# Patient Record
Sex: Female | Born: 1995 | Race: Black or African American | Hispanic: No | Marital: Single | Smoking: Never smoker
Health system: Southern US, Community
[De-identification: ages and names within clinical notes are randomized; demographics above are authoritative.]

## PROBLEM LIST (undated history)

## (undated) DIAGNOSIS — Z789 Other specified health status: Secondary | ICD-10-CM

## (undated) DIAGNOSIS — Z87440 Personal history of urinary (tract) infections: Secondary | ICD-10-CM

## (undated) HISTORY — DX: Personal history of urinary (tract) infections: Z87.440

## (undated) HISTORY — PX: NO PAST SURGERIES: SHX2092

---
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
## 2014-03-25 ENCOUNTER — Emergency Department: Payer: Self-pay | Admitting: Emergency Medicine

## 2014-06-28 ENCOUNTER — Inpatient Hospital Stay
Admit: 2014-06-28 | Disposition: A | Discharge status: Home / Self Care | Payer: Self-pay | Attending: Obstetrics and Gynecology | Admitting: Obstetrics and Gynecology

## 2014-06-28 LAB — CBC WITH DIFFERENTIAL/PLATELET
BASOS PCT: 0.5 %
Basophil #: 0.1 10*3/uL (ref 0.0–0.1)
EOS PCT: 0.2 %
Eosinophil #: 0 10*3/uL (ref 0.0–0.7)
HCT: 41.8 % (ref 35.0–47.0)
HGB: 13.8 g/dL (ref 12.0–16.0)
Lymphocyte #: 1.8 10*3/uL (ref 1.0–3.6)
Lymphocyte %: 14.6 %
MCH: 29.4 pg (ref 26.0–34.0)
MCHC: 33.1 g/dL (ref 32.0–36.0)
MCV: 89 fL (ref 80–100)
MONO ABS: 0.8 x10 3/mm (ref 0.2–0.9)
Monocyte %: 6.2 %
NEUTROS ABS: 9.9 10*3/uL — AB (ref 1.4–6.5)
Neutrophil %: 78.5 %
Platelet: 230 10*3/uL (ref 150–440)
RBC: 4.71 10*6/uL (ref 3.80–5.20)
RDW: 13.8 % (ref 11.5–14.5)
WBC: 12.6 10*3/uL — ABNORMAL HIGH (ref 3.6–11.0)

## 2014-06-28 LAB — DRUG SCREEN, URINE
AMPHETAMINES, UR SCREEN: NEGATIVE
BENZODIAZEPINE, UR SCRN: NEGATIVE
Barbiturates, Ur Screen: NEGATIVE
Cannabinoid 50 Ng, Ur ~~LOC~~: NEGATIVE
Cocaine Metabolite,Ur ~~LOC~~: NEGATIVE
MDMA (Ecstasy)Ur Screen: NEGATIVE
Methadone, Ur Screen: NEGATIVE
Opiate, Ur Screen: NEGATIVE
PHENCYCLIDINE (PCP) UR S: NEGATIVE
Tricyclic, Ur Screen: NEGATIVE

## 2014-06-28 LAB — GC/CHLAMYDIA PROBE AMP

## 2014-06-29 LAB — HEMATOCRIT: HCT: 31.8 % — ABNORMAL LOW (ref 35.0–47.0)

## 2014-07-27 NOTE — H&P (Signed)
L&D Evaluation:  History:  HPI 19 year old G1 at 728w2d [facility] presenting for SROM at term and contractions.  LOF reported around 2100 yesterday.  +CTX, +FM, no VB.  PNC noteable for transfer of care from [place], otherwise uncomplicated   Presents with contractions, leaking fluid   Patient's Medical History No Chronic Illness   Patient's Surgical History none   Medications Pre Natal Vitamins   Allergies shellfish   Social History none   Family History Non-Contributory   ROS:  ROS All systems were reviewed.  HEENT, CNS, GI, GU, Respiratory, CV, Renal and Musculoskeletal systems were found to be normal.   Exam:  Vital Signs stable   Urine Protein not completed   General no apparent distress   Mental Status clear   Chest clear   Heart normal sinus rhythm   Abdomen gravid, non-tender   Estimated Fetal Weight Average for gestational age   Fetal Position vtx   Back no CVAT   Edema no edema   Pelvic no external lesions   Mebranes Ruptured   Description clear   FHT normal rate with no decels, 125, moderate, +accles, no decels   Ucx regular   Impression:  Impression 19 year old G1 at 5628w2d   Plan:  Comments 1) Term labor - admit, expectant managemnt  2) Fetus - category I tracing  3) B pos / ABSC neg / HIV neg / RPR NR / RI / VZI / Hgb AA / 1-hr 82mg  / GBS negative  4) TDAP received 06/07/2014  5) Disposition - pending delivery   Electronic Signatures: Lorrene ReidStaebler, Hershey Knauer M (MD)  (Signed 11-Apr-16 02:05)  Authored: L&D Evaluation   Last Updated: 11-Apr-16 02:05 by Lorrene ReidStaebler, Dionisia Pacholski M (MD)

## 2015-02-15 DIAGNOSIS — O9989 Other specified diseases and conditions complicating pregnancy, childbirth and the puerperium: Secondary | ICD-10-CM | POA: Diagnosis present

## 2015-02-15 DIAGNOSIS — Z3A01 Less than 8 weeks gestation of pregnancy: Secondary | ICD-10-CM | POA: Insufficient documentation

## 2015-02-15 DIAGNOSIS — O2341 Unspecified infection of urinary tract in pregnancy, first trimester: Secondary | ICD-10-CM | POA: Insufficient documentation

## 2015-02-15 LAB — BASIC METABOLIC PANEL
Anion gap: 7 (ref 5–15)
BUN: 10 mg/dL (ref 6–20)
CO2: 26 mmol/L (ref 22–32)
CREATININE: 0.76 mg/dL (ref 0.44–1.00)
Calcium: 9.3 mg/dL (ref 8.9–10.3)
Chloride: 102 mmol/L (ref 101–111)
GFR calc non Af Amer: >60 mL/min (ref >60)
GLUCOSE: 88 mg/dL (ref 65–99)
Potassium: 3 mmol/L — ABNORMAL LOW (ref 3.5–5.1)
Sodium: 135 mmol/L (ref 135–145)

## 2015-02-15 NOTE — ED Notes (Signed)
POCT pregnancy positive 

## 2015-02-15 NOTE — ED Notes (Signed)
Pt in with low back and lower abd pain x 3 weeks.  No vomiting.diarrhea or dysuria.

## 2015-02-16 ENCOUNTER — Emergency Department
Admission: EM | Admit: 2015-02-16 | Discharge: 2015-02-16 | Disposition: A | Discharge status: Home / Self Care | Payer: Medicaid Other | Attending: Emergency Medicine | Admitting: Emergency Medicine

## 2015-02-16 ENCOUNTER — Emergency Department: Payer: Medicaid Other

## 2015-02-16 DIAGNOSIS — N390 Urinary tract infection, site not specified: Secondary | ICD-10-CM

## 2015-02-16 DIAGNOSIS — Z3491 Encounter for supervision of normal pregnancy, unspecified, first trimester: Secondary | ICD-10-CM

## 2015-02-16 LAB — URINALYSIS COMPLETE WITH MICROSCOPIC (ARMC ONLY)
Bilirubin Urine: NEGATIVE
Glucose, UA: NEGATIVE mg/dL
HGB URINE DIPSTICK: NEGATIVE
KETONES UR: NEGATIVE mg/dL
Nitrite: POSITIVE — AB
PH: 6 (ref 5.0–8.0)
Protein, ur: NEGATIVE mg/dL
Specific Gravity, Urine: 1.017 (ref 1.005–1.030)

## 2015-02-16 LAB — CBC WITH DIFFERENTIAL/PLATELET
Basophils Absolute: 0 10*3/uL (ref 0–0.1)
Basophils Relative: 1 %
Eosinophils Absolute: 0.1 10*3/uL (ref 0–0.7)
Eosinophils Relative: 1 %
HEMATOCRIT: 39.5 % (ref 35.0–47.0)
Hemoglobin: 12.9 g/dL (ref 12.0–16.0)
Lymphocytes Relative: 27 %
Lymphs Abs: 1.8 10*3/uL (ref 1.0–3.6)
MCH: 27 pg (ref 26.0–34.0)
MCHC: 32.6 g/dL (ref 32.0–36.0)
MCV: 82.7 fL (ref 80.0–100.0)
MONOS PCT: 10 %
Monocytes Absolute: 0.7 10*3/uL (ref 0.2–0.9)
Neutro Abs: 4.1 10*3/uL (ref 1.4–6.5)
Neutrophils Relative %: 61 %
Platelets: 324 10*3/uL (ref 150–440)
RBC: 4.78 MIL/uL (ref 3.80–5.20)
RDW: 16 % — ABNORMAL HIGH (ref 11.5–14.5)
WBC: 6.7 10*3/uL (ref 3.6–11.0)

## 2015-02-16 LAB — POCT PREGNANCY, URINE: PREG TEST UR: POSITIVE — AB

## 2015-02-16 LAB — HCG, QUANTITATIVE, PREGNANCY: hCG, Beta Chain, Quant, S: 87927 m[IU]/mL — ABNORMAL HIGH (ref <5)

## 2015-02-16 MED ORDER — CEPHALEXIN 500 MG PO CAPS: 500.0000 mg | ORAL_CAPSULE | Freq: Two times a day (BID) | ORAL | Status: AC

## 2015-02-16 MED ORDER — CEPHALEXIN 500 MG PO CAPS
500.0000 mg | ORAL_CAPSULE | Freq: Once | ORAL | Status: AC
  Administered 2015-02-16: 500 mg via ORAL
  Filled 2015-02-16: qty 1

## 2015-02-16 NOTE — ED Provider Notes (Signed)
[facility] Emergency Department Provider Note  ____________________________________________  Time seen: 2:25 AM  I have reviewed the triage vital signs and the nursing notes.   HISTORY  Chief Complaint Abdominal Pain     HPI Kaitlyn Richmond is a 19 y.o. female G1P1presents with pelvic discomfort 3 weeks. Patient states she took a home pregnancy test which was positive however that she does not believe the results and resulting in her visit to the emergency department tonight. Patient admits to urinary frequency and urgency.     Past medical history 1 previous pregnancy full-term no complications There are no active problems to display for this patient.   Past surgical history None No current outpatient prescriptions on file.  Allergies Shellfish allergy  No family history on file.  Social History Social History  Substance Use Topics  . Smoking status: Not on file  . Smokeless tobacco: Not on file  . Alcohol Use: Not on file    Review of Systems  Constitutional: Negative for fever. Eyes: Negative for visual changes. ENT: Negative for sore throat. Cardiovascular: Negative for chest pain. Respiratory: Negative for shortness of breath. Gastrointestinal: Negative for abdominal pain, vomiting and diarrhea. Genitourinary: Negative for dysuria. Positive urinary frequency and urgency Musculoskeletal: Negative for back pain. Skin: Negative for rash. Neurological: Negative for headaches, focal weakness or numbness.   10-point ROS otherwise negative.  ____________________________________________   PHYSICAL EXAM:  VITAL SIGNS: ED Triage Vitals  Enc Vitals Group     BP 02/15/15 2324 145/66 mmHg     Pulse Rate 02/15/15 2324 94     Resp 02/15/15 2324 18     Temp 02/15/15 2324 98 F (36.7 C)     Temp Source 02/15/15 2324 Oral     SpO2 02/15/15 2324 100 %     Weight 02/15/15 2324 130 lb (58.968 kg)     Height 02/15/15 2324   (1.549 m)     Head Cir --      Peak Flow --      Pain Score 02/15/15 2325 0     Pain Loc --      Pain Edu? --      Excl. in GC? --     Constitutional: Alert and oriented. Well appearing and in no distress. Eyes: Conjunctivae are normal. PERRL. Normal extraocular movements. ENT   Head: Normocephalic and atraumatic.   Nose: No congestion/rhinnorhea.   Mouth/Throat: Mucous membranes are moist.   Neck: No stridor. Hematological/Lymphatic/Immunilogical: No cervical lymphadenopathy. Cardiovascular: Normal rate, regular rhythm. Normal and symmetric distal pulses are present in all extremities. No murmurs, rubs, or gallops. Respiratory: Normal respiratory effort without tachypnea nor retractions. Breath sounds are clear and equal bilaterally. No wheezes/rales/rhonchi. Gastrointestinal: Soft and nontender. No distention. There is no CVA tenderness. Genitourinary: deferred Musculoskeletal: Nontender with normal range of motion in all extremities. No joint effusions.  No lower extremity tenderness nor edema. Neurologic:  Normal speech and language. No gross focal neurologic deficits are appreciated. Speech is normal.  Skin:  Skin is warm, dry and intact. No rash noted. Psychiatric: Mood and affect are normal. Speech and behavior are normal. Patient exhibits appropriate insight and judgment.  ____________________________________________    LABS (pertinent positives/negatives)  Labs Reviewed  CBC WITH DIFFERENTIAL/PLATELET - Abnormal; Notable for the following:    RDW 16.0 (*)    All other components within normal limits  BASIC METABOLIC PANEL - Abnormal; Notable for the following:    Potassium 3.0 (*)  All other components within normal limits  URINALYSIS COMPLETEWITH MICROSCOPIC ([facility] ONLY) - Abnormal; Notable for the following:    Color, Urine YELLOW (*)    APPearance CLOUDY (*)    Nitrite POSITIVE (*)    Leukocytes, UA 3+ (*)    Bacteria, UA FEW (*)    Squamous  Epithelial / LPF 6-30 (*)    All other components within normal limits  POCT PREGNANCY, URINE - Abnormal; Notable for the following:    Preg Test, Ur POSITIVE (*)    All other components within normal limits  HCG, QUANTITATIVE, PREGNANCY  POC URINE PREG, ED      RADIOLOGY     US Ob Transvaginal (Final result) Result time: 02/16/15 04:38:46   Final result by Rad Results In Interface (02/16/15 04:38:46)   Narrative:   CLINICAL DATA: Pelvic pain for 3 weeks  EXAM: OBSTETRIC <14 WK US AND TRANSVAGINAL OB US  TECHNIQUE: Both transabdominal and transvaginal ultrasound examinations were performed for complete evaluation of the gestation as well as the maternal uterus, adnexal regions, and pelvic cul-de-sac. Transvaginal technique was performed to assess early pregnancy.  COMPARISON: None.  FINDINGS: Intrauterine gestational sac: Single  Yolk sac: Yes  Embryo: Yes  Cardiac Activity: Yes  Heart Rate: 145 bpm  MSD:  mm  w   d  CRL: 9.0 mm  6 w  6 d         US EDC: 10/06/2015  Maternal uterus/adnexae: Normal right ovary. Nonvisualization of the left ovary. Small subchorionic hemorrhage. No free fluid.  IMPRESSION: Single living intrauterine gestation measuring 6 weeks 6 days by crown-rump length. Small subchorionic hemorrhage.   Electronically Signed By: Ellery Plunkaniel R Mitchell M.D. On: 02/16/2015 04:38          US OB Comp Less 14 Wks (Final result) Result time: 02/16/15 04:38:46   Final result by Rad Results In Interface (02/16/15 04:38:46)   Narrative:   CLINICAL DATA: Pelvic pain for 3 weeks  EXAM: OBSTETRIC <14 WK US AND TRANSVAGINAL OB US  TECHNIQUE: Both transabdominal and transvaginal ultrasound examinations were performed for complete evaluation of the gestation as well as the maternal uterus, adnexal regions, and pelvic cul-de-sac. Transvaginal technique was performed to assess early pregnancy.  COMPARISON:  None.  FINDINGS: Intrauterine gestational sac: Single  Yolk sac: Yes  Embryo: Yes  Cardiac Activity: Yes  Heart Rate: 145 bpm  MSD:  mm  w   d  CRL: 9.0 mm  6 w  6 d         US EDC: 10/06/2015  Maternal uterus/adnexae: Normal right ovary. Nonvisualization of the left ovary. Small subchorionic hemorrhage. No free fluid.  IMPRESSION: Single living intrauterine gestation measuring 6 weeks 6 days by crown-rump length. Small subchorionic hemorrhage.   Electronically Signed By: Ellery Plunkaniel R Mitchell M.D. On: 02/16/2015 04:38        _  INITIAL IMPRESSION / ASSESSMENT AND PLAN / ED COURSE  Pertinent labs & imaging results that were available during my care of the patient were reviewed by me and considered in my medical decision making (see chart for details).    ____________________________________________   FINAL CLINICAL IMPRESSION(S) / ED DIAGNOSES  Final diagnoses:  First trimester pregnancy  UTI (lower urinary tract infection)      Darci Currentandolph N Arabia Nylund, MD 02/16/15 [phone]

## 2015-02-16 NOTE — Discharge Instructions (Signed)
First Trimester of Pregnancy The first trimester of pregnancy is from week 1 until the end of week 12 (months 1 through 3). A week after a sperm fertilizes an egg, the egg will implant on the wall of the uterus. This embryo will begin to develop into a baby. Genes from you and your partner are forming the baby. The female genes determine whether the baby is a boy or a girl. At 6-8 weeks, the eyes and face are formed, and the heartbeat can be seen on ultrasound. At the end of 12 weeks, all the baby's organs are formed.  Now that you are pregnant, you will want to do everything you can to have a healthy baby. Two of the most important things are to get good prenatal care and to follow your health care provider's instructions. Prenatal care is all the medical care you receive before the baby's birth. This care will help prevent, find, and treat any problems during the pregnancy and childbirth. BODY CHANGES Your body goes through many changes during pregnancy. The changes vary from woman to woman.   You may gain or lose a couple of pounds at first.  You may feel sick to your stomach (nauseous) and throw up (vomit). If the vomiting is uncontrollable, call your health care provider.  You may tire easily.  You may develop headaches that can be relieved by medicines approved by your health care provider.  You may urinate more often. Painful urination may mean you have a bladder infection.  You may develop heartburn as a result of your pregnancy.  You may develop constipation because certain hormones are causing the muscles that push waste through your intestines to slow down.  You may develop hemorrhoids or swollen, bulging veins (varicose veins).  Your breasts may begin to grow larger and become tender. Your nipples may stick out more, and the tissue that surrounds them (areola) may become darker.  Your gums may bleed and may be sensitive to brushing and flossing.  Dark spots or blotches (chloasma,  mask of pregnancy) may develop on your face. This will likely fade after the baby is born.  Your menstrual periods will stop.  You may have a loss of appetite.  You may develop cravings for certain kinds of food.  You may have changes in your emotions from day to day, such as being excited to be pregnant or being concerned that something may go wrong with the pregnancy and baby.  You may have more vivid and strange dreams.  You may have changes in your hair. These can include thickening of your hair, rapid growth, and changes in texture. Some women also have hair loss during or after pregnancy, or hair that feels dry or thin. Your hair will most likely return to normal after your baby is born. WHAT TO EXPECT AT YOUR PRENATAL VISITS During a routine prenatal visit:  You will be weighed to make sure you and the baby are growing normally.  Your blood pressure will be taken.  Your abdomen will be measured to track your baby's growth.  The fetal heartbeat will be listened to starting around week 10 or 12 of your pregnancy.  Test results from any previous visits will be discussed. Your health care provider may ask you:  How you are feeling.  If you are feeling the baby move.  If you have had any abnormal symptoms, such as leaking fluid, bleeding, severe headaches, or abdominal cramping.  If you are using any tobacco products,   including cigarettes, chewing tobacco, and electronic cigarettes.  If you have any questions. Other tests that may be performed during your first trimester include:  Blood tests to find your blood type and to check for the presence of any previous infections. They will also be used to check for low iron levels (anemia) and Rh antibodies. Later in the pregnancy, blood tests for diabetes will be done along with other tests if problems develop.  Urine tests to check for infections, diabetes, or protein in the urine.  An ultrasound to confirm the proper growth  and development of the baby.  An amniocentesis to check for possible genetic problems.  Fetal screens for spina bifida and Down syndrome.  You may need other tests to make sure you and the baby are doing well.  HIV (human immunodeficiency virus) testing. Routine prenatal testing includes screening for HIV, unless you choose not to have this test. HOME CARE INSTRUCTIONS  Medicines  Follow your health care provider's instructions regarding medicine use. Specific medicines may be either safe or unsafe to take during pregnancy.  Take your prenatal vitamins as directed.  If you develop constipation, try taking a stool softener if your health care provider approves. Diet  Eat regular, well-balanced meals. Choose a variety of foods, such as meat or vegetable-based protein, fish, milk and low-fat dairy products, vegetables, fruits, and whole grain breads and cereals. Your health care provider will help you determine the amount of weight gain that is right for you.  Avoid raw meat and uncooked cheese. These carry germs that can cause birth defects in the baby.  Eating four or five small meals rather than three large meals a day may help relieve nausea and vomiting. If you start to feel nauseous, eating a few soda crackers can be helpful. Drinking liquids between meals instead of during meals also seems to help nausea and vomiting.  If you develop constipation, eat more high-fiber foods, such as fresh vegetables or fruit and whole grains. Drink enough fluids to keep your urine clear or pale yellow. Activity and Exercise  Exercise only as directed by your health care provider. Exercising will help you:  Control your weight.  Stay in shape.  Be prepared for labor and delivery.  Experiencing pain or cramping in the lower abdomen or low back is a good sign that you should stop exercising. Check with your health care provider before continuing normal exercises.  Try to avoid standing for long  periods of time. Move your legs often if you must stand in one place for a long time.  Avoid heavy lifting.  Wear low-heeled shoes, and practice good posture.  You may continue to have sex unless your health care provider directs you otherwise. Relief of Pain or Discomfort  Wear a good support bra for breast tenderness.   Take warm sitz baths to soothe any pain or discomfort caused by hemorrhoids. Use hemorrhoid cream if your health care provider approves.   Rest with your legs elevated if you have leg cramps or low back pain.  If you develop varicose veins in your legs, wear support hose. Elevate your feet for 15 minutes, 3-4 times a day. Limit salt in your diet. Prenatal Care  Schedule your prenatal visits by the twelfth week of pregnancy. They are usually scheduled monthly at first, then more often in the last 2 months before delivery.  Write down your questions. Take them to your prenatal visits.  Keep all your prenatal visits as directed by your   health care provider. Safety  Wear your seat belt at all times when driving.  Make a list of emergency phone numbers, including numbers for family, friends, the hospital, and police and fire departments. General Tips  Ask your health care provider for a referral to a local prenatal education class. Begin classes no later than at the beginning of month 6 of your pregnancy.  Ask for help if you have counseling or nutritional needs during pregnancy. Your health care provider can offer advice or refer you to specialists for help with various needs.  Do not use hot tubs, steam rooms, or saunas.  Do not douche or use tampons or scented sanitary pads.  Do not cross your legs for long periods of time.  Avoid cat litter boxes and soil used by cats. These carry germs that can cause birth defects in the baby and possibly loss of the fetus by miscarriage or stillbirth.  Avoid all smoking, herbs, alcohol, and medicines not prescribed by  your health care provider. Chemicals in these affect the formation and growth of the baby.  Do not use any tobacco products, including cigarettes, chewing tobacco, and electronic cigarettes. If you need help quitting, ask your health care provider. You may receive counseling support and other resources to help you quit.  Schedule a dentist appointment. At home, brush your teeth with a soft toothbrush and be gentle when you floss. SEEK MEDICAL CARE IF:   You have dizziness.  You have mild pelvic cramps, pelvic pressure, or nagging pain in the abdominal area.  You have persistent nausea, vomiting, or diarrhea.  You have a bad smelling vaginal discharge.  You have pain with urination.  You notice increased swelling in your face, hands, legs, or ankles. SEEK IMMEDIATE MEDICAL CARE IF:   You have a fever.  You are leaking fluid from your vagina.  You have spotting or bleeding from your vagina.  You have severe abdominal cramping or pain.  You have rapid weight gain or loss.  You vomit blood or material that looks like coffee grounds.  You are exposed to German measles and have never had them.  You are exposed to fifth disease or chickenpox.  You develop a severe headache.  You have shortness of breath.  You have any kind of trauma, such as from a fall or a car accident.   This information is not intended to replace advice given to you by your health care provider. Make sure you discuss any questions you have with your health care provider.   Document Released: 02/27/2001 Document Revised: 03/26/2014 Document Reviewed: 01/13/2013 Elsevier Interactive Patient Education 2016 [employer]  

## 2015-02-16 NOTE — ED Notes (Signed)
Pt gone to ultrasound via stretcher 

## 2015-03-20 NOTE — L&D Delivery Note (Signed)
Delivery Note At 5:27 AM a viable female was delivered via Vaginal, Spontaneous Delivery (Presentation: Right Occiput Anterior).  APGAR: 8, 9; weight 6 lb 13 oz (3090 g).   Placenta status: Intact, Spontaneous.  Cord: 3 vessels with the following complications: None.    Anesthesia: None  Episiotomy: None Lacerations: 2nd degree Suture Repair: 2.0 vicryl Est. Blood Loss (mL): 250  Quick  second stage. <1 minute shoulder dystocia relieved with McRoberts and suprapubic pressure. Dystocia could have been affected by maternal position and pushing efforts.  Infant to maternal abdomen for delayed cord clamping, cut by family member after pulsation stopped.   Baby's Name: undecided  Mom to postpartum.  Baby to Couplet care / Skin to Skin.  Kaitlyn Richmond, Kaitlyn Richmond 10/02/2015, 6:16 AM

## 2015-05-02 ENCOUNTER — Encounter: Payer: Self-pay | Admitting: *Deleted

## 2015-05-02 DIAGNOSIS — Z3A18 18 weeks gestation of pregnancy: Secondary | ICD-10-CM | POA: Insufficient documentation

## 2015-05-02 DIAGNOSIS — O9989 Other specified diseases and conditions complicating pregnancy, childbirth and the puerperium: Secondary | ICD-10-CM | POA: Insufficient documentation

## 2015-05-02 DIAGNOSIS — M545 Low back pain: Secondary | ICD-10-CM | POA: Insufficient documentation

## 2015-05-02 LAB — CBC
HEMATOCRIT: 34.5 % — AB (ref 35.0–47.0)
Hemoglobin: 11.6 g/dL — ABNORMAL LOW (ref 12.0–16.0)
MCH: 28.1 pg (ref 26.0–34.0)
MCHC: 33.6 g/dL (ref 32.0–36.0)
MCV: 83.5 fL (ref 80.0–100.0)
Platelets: 269 10*3/uL (ref 150–440)
RBC: 4.14 MIL/uL (ref 3.80–5.20)
RDW: 14 % (ref 11.5–14.5)
WBC: 7.6 10*3/uL (ref 3.6–11.0)

## 2015-05-02 LAB — COMPREHENSIVE METABOLIC PANEL
ALBUMIN: 3.1 g/dL — AB (ref 3.5–5.0)
ALT: 22 U/L (ref 14–54)
AST: 22 U/L (ref 15–41)
Alkaline Phosphatase: 58 U/L (ref 38–126)
Anion gap: 7 (ref 5–15)
BUN: 8 mg/dL (ref 6–20)
CHLORIDE: 103 mmol/L (ref 101–111)
CO2: 26 mmol/L (ref 22–32)
Calcium: 8.7 mg/dL — ABNORMAL LOW (ref 8.9–10.3)
Creatinine, Ser: 0.67 mg/dL (ref 0.44–1.00)
GFR calc Af Amer: >60 mL/min (ref >60)
GFR calc non Af Amer: >60 mL/min (ref >60)
GLUCOSE: 82 mg/dL (ref 65–99)
POTASSIUM: 3.4 mmol/L — AB (ref 3.5–5.1)
SODIUM: 136 mmol/L (ref 135–145)
Total Bilirubin: 0.6 mg/dL (ref 0.3–1.2)
Total Protein: 7.4 g/dL (ref 6.5–8.1)

## 2015-05-02 LAB — HCG, QUANTITATIVE, PREGNANCY: hCG, Beta Chain, Quant, S: 15478 m[IU]/mL — ABNORMAL HIGH (ref <5)

## 2015-05-02 LAB — URINALYSIS COMPLETE WITH MICROSCOPIC (ARMC ONLY)
Bilirubin Urine: NEGATIVE
Glucose, UA: NEGATIVE mg/dL
HGB URINE DIPSTICK: NEGATIVE
Ketones, ur: NEGATIVE mg/dL
Nitrite: NEGATIVE
PROTEIN: 30 mg/dL — AB
SPECIFIC GRAVITY, URINE: 1.02 (ref 1.005–1.030)
pH: 8 (ref 5.0–8.0)

## 2015-05-02 NOTE — ED Notes (Addendum)
Pt is pregnant approx 18 weeks.   Pt states no prenatal care.  Pt has abd pain and back pain.  No vag bleeding. No dysuria.

## 2015-05-03 ENCOUNTER — Emergency Department
Admission: EM | Admit: 2015-05-03 | Discharge: 2015-05-03 | Disposition: A | Discharge status: Home / Self Care | Payer: Medicaid Other | Attending: Emergency Medicine | Admitting: Emergency Medicine

## 2015-05-03 DIAGNOSIS — M545 Low back pain, unspecified: Secondary | ICD-10-CM

## 2015-05-03 DIAGNOSIS — Z3492 Encounter for supervision of normal pregnancy, unspecified, second trimester: Secondary | ICD-10-CM

## 2015-05-03 MED ORDER — ACETAMINOPHEN 325 MG PO TABS
650.0000 mg | ORAL_TABLET | Freq: Once | ORAL | Status: DC
  Filled 2015-05-03: qty 2

## 2015-05-03 NOTE — ED Notes (Signed)
Pt ambulatory to room 24 without difficulty or distress noted; reports G2P1, approx [redacted]wks pregnant (LMP 11-7), no prenatal care "waiting on medicaid"; reports pain to lower back and sides x week with no accomp symptoms; +BS, abd soft/nondist/nontender with palpation

## 2015-05-03 NOTE — ED Provider Notes (Signed)
[facility] Emergency Department Provider Note  ____________________________________________  Time seen: Approximately 120 AM  I have reviewed the triage vital signs and the nursing notes.   HISTORY  Chief Complaint Abdominal Pain and Back Pain    HPI Kaitlyn Richmond is a 20 y.o. female who comes into the hospital today with back and side pain. The patient reports that this started one to one half weeks ago. She has not taken anything for the pain and she reports that she does a lot of lifting all day. The patient denies problems with urination and reports that it just aches and hurts. Her pain as a 7 out of 10 in intensity. She denies any nausea or vomiting. Occasionally she reports she feels spasms and she gets a sharp shooting pain up her back but not down her legs. The patient does not have a primary care physician. She is currently [redacted] weeks pregnant and has not yet sought prenatal care. The patient did have an ultrasound in the emergency department multiple months ago but has not seen anyone since. She reports that she has had some back pain ever since her previous pregnancy and delivery. The patient is here for evaluation.   No past medical history on file.  There are no active problems to display for this patient.   No past surgical history on file.  No current outpatient prescriptions on file.  Allergies Shellfish allergy  No family history on file.  Social History Social History  Substance Use Topics  . Smoking status: Never Smoker   . Smokeless tobacco: Not on file  . Alcohol Use: No    Review of Systems Constitutional: No fever/chills Eyes: No visual changes. ENT: No sore throat. Cardiovascular: Denies chest pain. Respiratory: Denies shortness of breath. Gastrointestinal: No abdominal pain.  No nausea, no vomiting.  No diarrhea.  No constipation. Genitourinary: Negative for dysuria. Musculoskeletal:  back pain. Skin: Negative for  rash. Neurological: Negative for headaches, focal weakness or numbness.  10-point ROS otherwise negative.  ____________________________________________   PHYSICAL EXAM:  VITAL SIGNS: ED Triage Vitals  Enc Vitals Group     BP 05/02/15 2253 116/52 mmHg     Pulse Rate 05/02/15 2248 89     Resp 05/02/15 2248 18     Temp 05/02/15 2248 98.8 F (37.1 C)     Temp Source 05/02/15 2248 Oral     SpO2 05/02/15 2248 99 %     Weight 05/02/15 2248 140 lb (63.504 kg)     Height 05/02/15 2248  (1.575 m)     Head Cir --      Peak Flow --      Pain Score 05/02/15 2250 8     Pain Loc --      Pain Edu? --      Excl. in GC? --     Constitutional: Alert and oriented. Well appearing and in no acute distress. Eyes: Conjunctivae are normal. PERRL. EOMI. Head: Atraumatic. Nose: No congestion/rhinnorhea. Mouth/Throat: Mucous membranes are moist.  Oropharynx non-erythematous. Cardiovascular: Normal rate, regular rhythm. Grossly normal heart sounds.  Good peripheral circulation. Respiratory: Normal respiratory effort.  No retractions. Lungs CTAB. Gastrointestinal: Soft and nontender. No distention. No CVA tenderness Musculoskeletal: No lower extremity tenderness nor edema.  No tenderness to palpation of the patient's back or sides. Neurologic:  Normal speech and language.  Skin:  Skin is warm, dry and intact.  Psychiatric: Mood and affect are normal.   ____________________________________________   LABS (  all labs ordered are listed, but only abnormal results are displayed)  Labs Reviewed  COMPREHENSIVE METABOLIC PANEL - Abnormal; Notable for the following:    Potassium 3.4 (*)    Calcium 8.7 (*)    Albumin 3.1 (*)    All other components within normal limits  CBC - Abnormal; Notable for the following:    Hemoglobin 11.6 (*)    HCT 34.5 (*)    All other components within normal limits  URINALYSIS COMPLETEWITH MICROSCOPIC ([facility] ONLY) - Abnormal; Notable for the following:    Color,  Urine YELLOW (*)    APPearance CLOUDY (*)    Protein, ur 30 (*)    Leukocytes, UA 3+ (*)    Bacteria, UA RARE (*)    Squamous Epithelial / LPF 6-30 (*)    All other components within normal limits  HCG, QUANTITATIVE, PREGNANCY - Abnormal; Notable for the following:    hCG, Beta Chain, Quant, S 15478 (*)    All other components within normal limits   ____________________________________________  EKG  None ____________________________________________  RADIOLOGY  None ____________________________________________   PROCEDURES  Procedure(s) performed: None  Critical Care performed: No  ____________________________________________   INITIAL IMPRESSION / ASSESSMENT AND PLAN / ED COURSE  Pertinent labs & imaging results that were available during my care of the patient were reviewed by me and considered in my medical decision making (see chart for details).  The patient is a 20 year old female who is [redacted] weeks pregnant and comes into the hospital today with some back pain. The patient does not have tenderness to palpation has not been taking any medications. Her urine does not show any significant infection and has rare bacteria. The patient's pain may be contributed to her pregnancy as well as some previous chronic pain. The patient receive a dose of Tylenol and will be reassessed.  The patient's pain is improved. She will be discharged to home.  ____________________________________________   FINAL CLINICAL IMPRESSION(S) / ED DIAGNOSES  Final diagnoses:  Bilateral low back pain without sciatica  Second trimester pregnancy      Rebecka Apley, MD 05/03/15 [phone]

## 2015-05-03 NOTE — Discharge Instructions (Signed)
Back Pain in Pregnancy °Back pain during pregnancy is common. It happens in about half of all pregnancies. It is important for you and your baby that you remain active during your pregnancy. If you feel that back pain is not allowing you to remain active or sleep well, it is time to see your caregiver. Back pain may be caused by several factors related to changes during your pregnancy. Fortunately, unless you had trouble with your back before your pregnancy, the pain is likely to get better after you deliver. °Low back pain usually occurs between the fifth and seventh months of pregnancy. It can, however, happen in the first couple months. Factors that increase the risk of back problems include:  °· Previous back problems. °· Injury to your back. °· Having twins or multiple births. °· A chronic cough. °· Stress. °· Job-related repetitive motions. °· Muscle or spinal disease in the back. °· Family history of back problems, ruptured (herniated) discs, or osteoporosis. °· Depression, anxiety, and panic attacks. °CAUSES  °· When you are pregnant, your body produces a hormone called relaxin. This hormone makes the ligaments connecting the low back and pubic bones more flexible. This flexibility allows the baby to be delivered more easily. When your ligaments are loose, your muscles need to work harder to support your back. Soreness in your back can come from tired muscles. Soreness can also come from back tissues that are irritated since they are receiving less support. °· As the baby grows, it puts pressure on the nerves and blood vessels in your pelvis. This can cause back pain. °· As the baby grows and gets heavier during pregnancy, the uterus pushes the stomach muscles forward and changes your center of gravity. This makes your back muscles work harder to maintain good posture. °SYMPTOMS  °Lumbar pain during pregnancy °Lumbar pain during pregnancy usually occurs at or above the waist in the center of the back. There  may be pain and numbness that radiates into your leg or foot. This is similar to low back pain experienced by non-pregnant women. It usually increases with sitting for long periods of time, standing, or repetitive lifting. Tenderness may also be present in the muscles along your upper back. °Posterior pelvic pain during pregnancy °Pain in the back of the pelvis is more common than lumbar pain in pregnancy. It is a deep pain felt in your side at the waistline, or across the tailbone (sacrum), or in both places. You may have pain on one or both sides. This pain can also go into the buttocks and backs of the upper thighs. Pubic and groin pain may also be present. The pain does not quickly resolve with rest, and morning stiffness may also be present. °Pelvic pain during pregnancy can be brought on by most activities. A high level of fitness before and during pregnancy may or may not prevent this problem. Labor pain is usually 1 to 2 minutes apart, lasts for about 1 minute, and involves a bearing down feeling or pressure in your pelvis. However, if you are at term with the pregnancy, constant low back pain can be the beginning of early labor, and you should be aware of this. °DIAGNOSIS  °X-rays of the back should not be done during the first 12 to 14 weeks of the pregnancy and only when absolutely necessary during the rest of the pregnancy. MRIs do not give off radiation and are safe during pregnancy. MRIs also should only be done when absolutely necessary. °HOME CARE INSTRUCTIONS °· Exercise   as directed by your caregiver. Exercise is the most effective way to prevent or manage back pain. If you have a back problem, it is especially important to avoid sports that require sudden body movements. Swimming and walking are great activities. °· Do not stand in one place for long periods of time. °· Do not wear high heels. °· Sit in chairs with good posture. Use a pillow on your lower back if necessary. Make sure your head  rests over your shoulders and is not hanging forward. °· Try sleeping on your side, preferably the left side, with a pillow or two between your legs. If you are sore after a night's rest, your bed may be too soft. Try placing a board between your mattress and box spring. °· Listen to your body when lifting. If you are experiencing pain, ask for help or try bending your knees more so you can use your leg muscles rather than your back muscles. Squat down when picking up something from the floor. Do not bend over. °· Eat a healthy diet. Try to gain weight within your caregiver's recommendations. °· Use heat or cold packs 3 to 4 times a day for 15 minutes to help with the pain. °· Only take over-the-counter or prescription medicines for pain, discomfort, or fever as directed by your caregiver. °Sudden (acute) back pain °· Use bed rest for only the most extreme, acute episodes of back pain. Prolonged bed rest over 48 hours will aggravate your condition. °· Ice is very effective for acute conditions. °¨ Put ice in a plastic bag. °¨ Place a towel between your skin and the bag. °¨ Leave the ice on for 10 to 20 minutes every 2 hours, or as needed. °· Using heat packs for 30 minutes prior to activities is also helpful. °Continued back pain °See your caregiver if you have continued problems. Your caregiver can help or refer you for appropriate physical therapy. With conditioning, most back problems can be avoided. Sometimes, a more serious issue may be the cause of back pain. You should be seen right away if new problems seem to be developing. Your caregiver may recommend: °· A maternity girdle. °· An elastic sling. °· A back brace. °· A massage therapist or acupuncture. °SEEK MEDICAL CARE IF:  °· You are not able to do most of your daily activities, even when taking the pain medicine you were given. °· You need a referral to a physical therapist or chiropractor. °· You want to try acupuncture. °SEEK IMMEDIATE MEDICAL CARE  IF: °· You develop numbness, tingling, weakness, or problems with the use of your arms or legs. °· You develop severe back pain that is no longer relieved with medicines. °· You have a sudden change in bowel or bladder control. °· You have increasing pain in other areas of the body. °· You develop shortness of breath, dizziness, or fainting. °· You develop nausea, vomiting, or sweating. °· You have back pain which is similar to labor pains. °· You have back pain along with your water breaking or vaginal bleeding. °· You have back pain or numbness that travels down your leg. °· Your back pain developed after you fell. °· You develop pain on one side of your back. You may have a kidney stone. °· You see blood in your urine. You may have a bladder infection or kidney stone. °· You have back pain with blisters. You may have shingles. °Back pain is fairly common during pregnancy but should not be accepted as just part of   the process. Back pain should always be treated as soon as possible. This will make your pregnancy as pleasant as possible.   This information is not intended to replace advice given to you by your health care provider. Make sure you discuss any questions you have with your health care provider.   Document Released: 06/13/2005 Document Revised: 05/28/2011 Document Reviewed: 07/25/2010 Elsevier Interactive Patient Education 2016 [employer].  Second Trimester of Pregnancy The second trimester is from week 13 through week 28, months 4 through 6. The second trimester is often a time when you feel your best. Your body has also adjusted to being pregnant, and you begin to feel better physically. Usually, morning sickness has lessened or quit completely, you may have more energy, and you may have an increase in appetite. The second trimester is also a time when the fetus is growing rapidly. At the end of the sixth month, the fetus is about 9 inches long and weighs about 1 pounds. You will likely  begin to feel the baby move (quickening) between 18 and 20 weeks of the pregnancy. BODY CHANGES Your body goes through many changes during pregnancy. The changes vary from woman to woman.   Your weight will continue to increase. You will notice your lower abdomen bulging out.  You may begin to get stretch marks on your hips, abdomen, and breasts.  You may develop headaches that can be relieved by medicines approved by your health care provider.  You may urinate more often because the fetus is pressing on your bladder.  You may develop or continue to have heartburn as a result of your pregnancy.  You may develop constipation because certain hormones are causing the muscles that push waste through your intestines to slow down.  You may develop hemorrhoids or swollen, bulging veins (varicose veins).  You may have back pain because of the weight gain and pregnancy hormones relaxing your joints between the bones in your pelvis and as a result of a shift in weight and the muscles that support your balance.  Your breasts will continue to grow and be tender.  Your gums may bleed and may be sensitive to brushing and flossing.  Dark spots or blotches (chloasma, mask of pregnancy) may develop on your face. This will likely fade after the baby is born.  A dark line from your belly button to the pubic area (linea nigra) may appear. This will likely fade after the baby is born.  You may have changes in your hair. These can include thickening of your hair, rapid growth, and changes in texture. Some women also have hair loss during or after pregnancy, or hair that feels dry or thin. Your hair will most likely return to normal after your baby is born. WHAT TO EXPECT AT YOUR PRENATAL VISITS During a routine prenatal visit:  You will be weighed to make sure you and the fetus are growing normally.  Your blood pressure will be taken.  Your abdomen will be measured to track your baby's growth.  The  fetal heartbeat will be listened to.  Any test results from the previous visit will be discussed. Your health care provider may ask you:  How you are feeling.  If you are feeling the baby move.  If you have had any abnormal symptoms, such as leaking fluid, bleeding, severe headaches, or abdominal cramping.  If you are using any tobacco products, including cigarettes, chewing tobacco, and electronic cigarettes.  If you have any questions. Other tests  that may be performed during your second trimester include:  Blood tests that check for:  Low iron levels (anemia).  Gestational diabetes (between 24 and 28 weeks).  Rh antibodies.  Urine tests to check for infections, diabetes, or protein in the urine.  An ultrasound to confirm the proper growth and development of the baby.  An amniocentesis to check for possible genetic problems.  Fetal screens for spina bifida and Down syndrome.  HIV (human immunodeficiency virus) testing. Routine prenatal testing includes screening for HIV, unless you choose not to have this test. HOME CARE INSTRUCTIONS   Avoid all smoking, herbs, alcohol, and unprescribed drugs. These chemicals affect the formation and growth of the baby.  Do not use any tobacco products, including cigarettes, chewing tobacco, and electronic cigarettes. If you need help quitting, ask your health care provider. You may receive counseling support and other resources to help you quit.  Follow your health care provider's instructions regarding medicine use. There are medicines that are either safe or unsafe to take during pregnancy.  Exercise only as directed by your health care provider. Experiencing uterine cramps is a good sign to stop exercising.  Continue to eat regular, healthy meals.  Wear a good support bra for breast tenderness.  Do not use hot tubs, steam rooms, or saunas.  Wear your seat belt at all times when driving.  Avoid raw meat, uncooked cheese, cat  litter boxes, and soil used by cats. These carry germs that can cause birth defects in the baby.  Take your prenatal vitamins.  Take 1500-2000 mg of calcium daily starting at the 20th week of pregnancy until you deliver your baby.  Try taking a stool softener (if your health care provider approves) if you develop constipation. Eat more high-fiber foods, such as fresh vegetables or fruit and whole grains. Drink plenty of fluids to keep your urine clear or pale yellow.  Take warm sitz baths to soothe any pain or discomfort caused by hemorrhoids. Use hemorrhoid cream if your health care provider approves.  If you develop varicose veins, wear support hose. Elevate your feet for 15 minutes, 3-4 times a day. Limit salt in your diet.  Avoid heavy lifting, wear low heel shoes, and practice good posture.  Rest with your legs elevated if you have leg cramps or low back pain.  Visit your dentist if you have not gone yet during your pregnancy. Use a soft toothbrush to brush your teeth and be gentle when you floss.  A sexual relationship may be continued unless your health care provider directs you otherwise.  Continue to go to all your prenatal visits as directed by your health care provider. SEEK MEDICAL CARE IF:   You have dizziness.  You have mild pelvic cramps, pelvic pressure, or nagging pain in the abdominal area.  You have persistent nausea, vomiting, or diarrhea.  You have a bad smelling vaginal discharge.  You have pain with urination. SEEK IMMEDIATE MEDICAL CARE IF:   You have a fever.  You are leaking fluid from your vagina.  You have spotting or bleeding from your vagina.  You have severe abdominal cramping or pain.  You have rapid weight gain or loss.  You have shortness of breath with chest pain.  You notice sudden or extreme swelling of your face, hands, ankles, feet, or legs.  You have not felt your baby move in over an hour.  You have severe headaches that do  not go away with medicine.  You have vision  changes.   This information is not intended to replace advice given to you by your health care provider. Make sure you discuss any questions you have with your health care provider.   Document Released: 02/27/2001 Document Revised: 03/26/2014 Document Reviewed: 05/06/2012 Elsevier Interactive Patient Education [employer]

## 2015-05-03 NOTE — ED Notes (Signed)
Pt wishes to leave AMA, Dr Zenda Alpers notified

## 2015-06-15 LAB — HM PAP SMEAR: HM PAP: NEGATIVE

## 2015-06-16 LAB — OB RESULTS CONSOLE HEPATITIS B SURFACE ANTIGEN: Hepatitis B Surface Ag: NEGATIVE

## 2015-06-16 LAB — OB RESULTS CONSOLE RUBELLA ANTIBODY, IGM: Rubella: IMMUNE

## 2015-06-16 LAB — OB RESULTS CONSOLE ABO/RH: RH Type: POSITIVE

## 2015-06-16 LAB — OB RESULTS CONSOLE VARICELLA ZOSTER ANTIBODY, IGG: Varicella: IMMUNE

## 2015-06-16 LAB — OB RESULTS CONSOLE ANTIBODY SCREEN: ANTIBODY SCREEN: NEGATIVE

## 2015-06-16 LAB — OB RESULTS CONSOLE RPR: RPR: NONREACTIVE

## 2015-09-16 LAB — OB RESULTS CONSOLE GBS: STREP GROUP B AG: POSITIVE

## 2015-10-02 ENCOUNTER — Inpatient Hospital Stay
Admission: EM | Admit: 2015-10-02 | Discharge: 2015-10-04 | DRG: 775 | Disposition: A | Discharge status: Home / Self Care | Payer: Medicaid Other | Attending: Obstetrics and Gynecology | Admitting: Obstetrics and Gynecology

## 2015-10-02 ENCOUNTER — Encounter: Payer: Self-pay | Admitting: *Deleted

## 2015-10-02 DIAGNOSIS — O9902 Anemia complicating childbirth: Secondary | ICD-10-CM | POA: Diagnosis present

## 2015-10-02 DIAGNOSIS — Z3A39 39 weeks gestation of pregnancy: Secondary | ICD-10-CM

## 2015-10-02 DIAGNOSIS — O99824 Streptococcus B carrier state complicating childbirth: Principal | ICD-10-CM | POA: Diagnosis present

## 2015-10-02 LAB — CBC
HEMATOCRIT: 36.3 % (ref 35.0–47.0)
HEMOGLOBIN: 12 g/dL (ref 12.0–16.0)
MCH: 25.8 pg — AB (ref 26.0–34.0)
MCHC: 33.2 g/dL (ref 32.0–36.0)
MCV: 77.8 fL — AB (ref 80.0–100.0)
Platelets: 261 10*3/uL (ref 150–440)
RBC: 4.67 MIL/uL (ref 3.80–5.20)
RDW: 16.3 % — ABNORMAL HIGH (ref 11.5–14.5)
WBC: 12.2 10*3/uL — ABNORMAL HIGH (ref 3.6–11.0)

## 2015-10-02 LAB — TYPE AND SCREEN
ABO/RH(D): B POS
ANTIBODY SCREEN: NEGATIVE

## 2015-10-02 LAB — CHLAMYDIA/NGC RT PCR (ARMC ONLY)
CHLAMYDIA TR: NOT DETECTED
N GONORRHOEAE: NOT DETECTED

## 2015-10-02 MED ORDER — KETOROLAC TROMETHAMINE 30 MG/ML IJ SOLN: 30.0000 mg | Freq: Once | INTRAMUSCULAR | Status: DC

## 2015-10-02 MED ORDER — OXYTOCIN BOLUS FROM INFUSION
500.0000 mL | INTRAVENOUS | Status: DC
  Administered 2015-10-02: 500 mL via INTRAVENOUS

## 2015-10-02 MED ORDER — PRENATAL MULTIVITAMIN CH
1.0000 | ORAL_TABLET | Freq: Every day | ORAL | Status: DC
  Administered 2015-10-02: 1 via ORAL
  Filled 2015-10-02: qty 1

## 2015-10-02 MED ORDER — ACETAMINOPHEN 325 MG PO TABS: 650.0000 mg | ORAL_TABLET | ORAL | Status: DC | PRN

## 2015-10-02 MED ORDER — KETOROLAC TROMETHAMINE 30 MG/ML IJ SOLN
30.0000 mg | Freq: Once | INTRAMUSCULAR | Status: AC
  Administered 2015-10-02: 30 mg via INTRAVENOUS

## 2015-10-02 MED ORDER — OXYCODONE-ACETAMINOPHEN 5-325 MG PO TABS
1.0000 | ORAL_TABLET | ORAL | Status: DC | PRN
  Administered 2015-10-02 – 2015-10-04 (×4): 1 via ORAL
  Filled 2015-10-02 (×4): qty 1

## 2015-10-02 MED ORDER — KETOROLAC TROMETHAMINE 30 MG/ML IJ SOLN
INTRAMUSCULAR | Status: AC
  Filled 2015-10-02: qty 1

## 2015-10-02 MED ORDER — CEFAZOLIN SODIUM-DEXTROSE 2-4 GM/100ML-% IV SOLN: 2.0000 g | Freq: Once | INTRAVENOUS | Status: DC

## 2015-10-02 MED ORDER — OXYTOCIN 40 UNITS IN LACTATED RINGERS INFUSION - SIMPLE MED
INTRAVENOUS | Status: AC
  Filled 2015-10-02: qty 1000

## 2015-10-02 MED ORDER — OXYCODONE-ACETAMINOPHEN 5-325 MG PO TABS: 2.0000 | ORAL_TABLET | ORAL | Status: DC | PRN

## 2015-10-02 MED ORDER — ZOLPIDEM TARTRATE 5 MG PO TABS: 5.0000 mg | ORAL_TABLET | Freq: Every evening | ORAL | Status: DC | PRN

## 2015-10-02 MED ORDER — DIBUCAINE 1 % RE OINT: 1.0000 "application " | TOPICAL_OINTMENT | RECTAL | Status: DC | PRN

## 2015-10-02 MED ORDER — ONDANSETRON HCL 4 MG PO TABS: 4.0000 mg | ORAL_TABLET | ORAL | Status: DC | PRN

## 2015-10-02 MED ORDER — SIMETHICONE 80 MG PO CHEW: 80.0000 mg | CHEWABLE_TABLET | ORAL | Status: DC | PRN

## 2015-10-02 MED ORDER — LACTATED RINGERS IV SOLN: 500.0000 mL | INTRAVENOUS | Status: DC | PRN

## 2015-10-02 MED ORDER — OXYTOCIN 40 UNITS IN LACTATED RINGERS INFUSION - SIMPLE MED
2.5000 [IU]/h | INTRAVENOUS | Status: DC
  Administered 2015-10-02: 2.5 [IU]/h via INTRAVENOUS

## 2015-10-02 MED ORDER — IBUPROFEN 600 MG PO TABS
600.0000 mg | ORAL_TABLET | Freq: Four times a day (QID) | ORAL | Status: DC
  Administered 2015-10-02 – 2015-10-03 (×6): 600 mg via ORAL
  Filled 2015-10-02 (×8): qty 1

## 2015-10-02 MED ORDER — DIPHENHYDRAMINE HCL 25 MG PO CAPS: 25.0000 mg | ORAL_CAPSULE | Freq: Four times a day (QID) | ORAL | Status: DC | PRN

## 2015-10-02 MED ORDER — FENTANYL CITRATE (PF) 100 MCG/2ML IJ SOLN
INTRAMUSCULAR | Status: AC
  Filled 2015-10-02: qty 2

## 2015-10-02 MED ORDER — LACTATED RINGERS IV SOLN: INTRAVENOUS | Status: DC

## 2015-10-02 MED ORDER — WITCH HAZEL-GLYCERIN EX PADS: 1.0000 "application " | MEDICATED_PAD | CUTANEOUS | Status: DC | PRN

## 2015-10-02 MED ORDER — FERROUS SULFATE 325 (65 FE) MG PO TABS
325.0000 mg | ORAL_TABLET | Freq: Every day | ORAL | Status: DC
  Administered 2015-10-03 – 2015-10-04 (×2): 325 mg via ORAL
  Filled 2015-10-02 (×2): qty 1

## 2015-10-02 MED ORDER — BENZOCAINE-MENTHOL 20-0.5 % EX AERO: 1.0000 "application " | INHALATION_SPRAY | CUTANEOUS | Status: DC | PRN

## 2015-10-02 MED ORDER — LIDOCAINE HCL (PF) 1 % IJ SOLN
INTRAMUSCULAR | Status: AC
  Filled 2015-10-02: qty 30

## 2015-10-02 MED ORDER — COCONUT OIL OIL: 1.0000 "application " | TOPICAL_OIL | Status: DC | PRN

## 2015-10-02 MED ORDER — LACTATED RINGERS IV SOLN
INTRAVENOUS | Status: DC
  Administered 2015-10-02: 1000 mL via INTRAVENOUS

## 2015-10-02 MED ORDER — CEFAZOLIN IN D5W 1 GM/50ML IV SOLN: 1.0000 g | Freq: Three times a day (TID) | INTRAVENOUS | Status: DC

## 2015-10-02 MED ORDER — ONDANSETRON HCL 4 MG/2ML IJ SOLN: 4.0000 mg | INTRAMUSCULAR | Status: DC | PRN

## 2015-10-02 MED ORDER — CEFAZOLIN SODIUM-DEXTROSE 2-4 GM/100ML-% IV SOLN
INTRAVENOUS | Status: AC
  Administered 2015-10-02: 2000 mg
  Filled 2015-10-02: qty 100

## 2015-10-02 MED ORDER — DOCUSATE SODIUM 100 MG PO CAPS
100.0000 mg | ORAL_CAPSULE | Freq: Two times a day (BID) | ORAL | Status: DC
  Administered 2015-10-02 – 2015-10-04 (×4): 100 mg via ORAL
  Filled 2015-10-02 (×4): qty 1

## 2015-10-02 NOTE — OB Triage Note (Signed)
Presents with complaint of contractions and possible leaking fluid.

## 2015-10-02 NOTE — Discharge Summary (Signed)
Obstetrical Discharge Summary  Date of Admission: 10/02/2015 Date of Discharge: 10/04/15  Primary OB: [facility]  Gestational Age at Delivery: [id number]   Antepartum complications: anemia and late to care Reason for Admission: labor Date of Delivery: 10/02/15  Delivered By: T. Yvette Roark, CNM Delivery Type: spontaneous vaginal delivery Intrapartum complications/course: None Anesthesia: none Placenta: Delivered and expressed via active management. Intact: yes. To pathology: no.  Laceration: 2nd degree Episiotomy: none EBL: 250 Baby: Liveborn female, APGARs 8/9, weight 3090 g.    Discharge Diagnosis: Delivered.   Postpartum course: Normal PP course. Pt ambulating, voiding and tolerating po. Ready for d/c home on PPD #2 Discharge Vital Signs:  Filed Vitals:   10/03/15 1604 10/03/15 2114 10/04/15 0042 10/04/15 0735  BP: 119/64 111/59 109/71 95/56  Pulse: 73 55 65 55  Temp: 98.2 F (36.8 C) 98.2 F (36.8 C) 98.5 F (36.9 C) 98 F (36.7 C)  TempSrc: Oral Oral Oral Oral  Resp: 18 18 18 20   Height:      Weight:      SpO2:  100% 100%       Discharge Exam:  NAD Perineum: WNL Abdomen: firm fundus below the umbilicus. RRR no MRGs CTAB Ext: no c/c/e   Recent Labs Lab 10/02/15 0457  WBC 12.2*  HGB 12.0  HCT 36.3  PLT 261  HCT PP: 32.5  Disposition: Home  Rh Immune globulin given: not applicable Rubella vaccine given: not applicable Tdap vaccine given in AP or PP setting: yes Flu vaccine given in AP or PP setting: N/A  Contraception: Depo-Provera - given at discharge and Rx'd  Prenatal/Postnatal Panel: B+//Rubella Immune//Varicella Immune//RPR negative//HIV negative/HepB Surface Ag negative//plans to bottle feed  Plan:  Kaitlyn Richmond was discharged to home in good condition. Pt to make follow-up appointment with [facility] in 6 weeks for a postpartum visit  Discharge Medications:   Medication List    TAKE these medications        ibuprofen 600 MG tablet  Commonly  known as:  ADVIL,MOTRIN  Take 1 tablet (600 mg total) by mouth every 6 (six) hours.     medroxyPROGESTERone 150 MG/ML injection  Commonly known as:  DEPO-PROVERA  Inject 1 mL (150 mg total) into the muscle every 3 (three) months.     oxyCODONE-acetaminophen 5-325 MG tablet  Commonly known as:  PERCOCET/ROXICET  Take 1 tablet by mouth every 4 (four) hours as needed (pain scale 4-7).

## 2015-10-02 NOTE — H&P (Signed)
Obstetric History and Physical  Kaitlyn Richmond is a 20 y.o. G2P1001 with Estimated Date of Delivery: 10/06/15 per 24 week US who presents at 422w3d  presenting for contractions with SROM shortly after arrival. Patient states she has been having regular contractions, bloody show, intact membranes, with active fetal movement.    Prenatal Course Source of Care: [facility]  with onset of care at 23 weeks Pregnancy complications or risks: Late to prenatal care Anemia - not taking Fe UTI  Patient Active Problem List   Diagnosis Date Noted  . Indication for care in labor or delivery 10/02/2015   She plans to breastfeed She desires Depo-Provera for postpartum contraception.   Prenatal labs and studies: ABO, Rh: B+  Antibody: neg Rubella: Immune per vaccine x 2 Varicella: Immune per vaccine x 2 RPR:  NR HBsAg:  Neg HIV: Neg GC/CT: Neg/Neg GBS: Positive 1 hr Glucola: 87   Genetic screening: too late to care  TDAP: UTD   Prenatal Transfer Tool   History reviewed. No pertinent past medical history.  History reviewed. No pertinent past surgical history.  OB History  Gravida Para Term Preterm AB SAB TAB Ectopic Multiple Living  2 1 1  0 0 0 0 0 0 1    # Outcome Date GA Lbr Len/2nd Weight Sex Delivery Anes PTL Lv  2 Current           1 Term               Social History   Social History  . Marital Status: Single    Spouse Name: N/A  . Number of Children: N/A  . Years of Education: N/A   Social History Main Topics  . Smoking status: Never Smoker   . Smokeless tobacco: None  . Alcohol Use: No  . Drug Use: None  . Sexual Activity: Not Asked   Other Topics Concern  . None   Social History Narrative    Family hx: cancer, diabetes, hypertension  No prescriptions prior to admission    Allergies  Allergen Reactions  . Penicillins Anaphylaxis  . Shellfish Allergy Swelling    Review of Systems: Negative except for what is mentioned in HPI.  Physical Exam: BP 126/65  mmHg  Pulse 104  Temp(Src) 97.7 F (36.5 C) (Oral)  Resp 18  Ht 5\' 2"  (1.575 m)  Wt 152 lb (68.947 kg)  BMI 27.79 kg/m2  LMP 01/24/2015 (Approximate) GENERAL: Well-developed, well-nourished female in no acute distress.  ABDOMEN: Soft, nontender, nondistended, gravid. EXTREMITIES: Nontender, no edema Cervical Exam:6 cm on admission, quickly proceeded to complete Presentation: cephalic FHT: 130 Contractions: regular  Pertinent Labs/Studies:   Results for orders placed or performed during the hospital encounter of 10/02/15 (from the past 24 hour(s))  CBC     Status: Abnormal   Collection Time: 10/02/15  4:57 AM  Result Value Ref Range   WBC 12.2 (H) 3.6 - 11.0 K/uL   RBC 4.67 3.80 - 5.20 MIL/uL   Hemoglobin 12.0 12.0 - 16.0 g/dL   HCT 16.136.3 09.635.0 - 04.547.0 %   MCV 77.8 (L) 80.0 - 100.0 fL   MCH 25.8 (L) 26.0 - 34.0 pg   MCHC 33.2 32.0 - 36.0 g/dL   RDW 40.916.3 (H) 81.111.5 - 91.414.5 %   Platelets 261 150 - 440 K/uL  Type and screen [facility]     Status: None (Preliminary result)   Collection Time: 10/02/15  4:57 AM  Result Value Ref Range  ABO/RH(D) PENDING    Antibody Screen PENDING    Sample Expiration 10/05/2015     Assessment : IUP at [redacted]w[redacted]d in active labor  Plan: Admit for labor and Antibiotics for GBS prophylaxis  See delivery note

## 2015-10-03 LAB — RPR: RPR Ser Ql: NONREACTIVE

## 2015-10-03 LAB — CBC
HEMATOCRIT: 32.5 % — AB (ref 35.0–47.0)
Hemoglobin: 10.7 g/dL — ABNORMAL LOW (ref 12.0–16.0)
MCH: 26 pg (ref 26.0–34.0)
MCHC: 33.1 g/dL (ref 32.0–36.0)
MCV: 78.6 fL — AB (ref 80.0–100.0)
PLATELETS: 204 10*3/uL (ref 150–440)
RBC: 4.13 MIL/uL (ref 3.80–5.20)
RDW: 16 % — AB (ref 11.5–14.5)
WBC: 10.1 10*3/uL (ref 3.6–11.0)

## 2015-10-03 NOTE — Progress Notes (Signed)
Post Partum Day 1 Subjective: Doing well, no complaints.  Tolerating regular diet, pain with PO meds, voiding and ambulating without difficulty.  No CP SOB F/C N/V or leg pain No HA, change of vision, RUQ/epigastric pain  Objective: BP 103/60 mmHg  Pulse 64  Temp(Src) 97.6 F (36.4 C) (Oral)  Resp 18  Ht 5\' 2"  (1.575 m)  Wt 68.947 kg (152 lb)  BMI 27.79 kg/m2  SpO2 100%  LMP 01/24/2015 (Approximate)  Breastfeeding  Physical Exam:  General: NAD CV: RRR Pulm: nl effort, CTABL Lochia: moderate Uterine Fundus: fundus firm and below umbilicus DVT Evaluation: no cords, ttp LEs    Recent Labs  10/02/15 0457 10/03/15 0703  HGB 12.0 10.7*  HCT 36.3 32.5*  WBC 12.2* 10.1  PLT 261 204    Assessment/Plan: 20 y.o. G2P2001 postpartum day # 1  1. Continue routine postpartum care 2. O+, Rubella Immune, Varicella Immune 3. TDAP UTD 4. Breastfeeding/Depo 5. Disposition: home tomorrow   Tresea MallGLEDHILL,Jianni Batten, CNM

## 2015-10-04 MED ORDER — IBUPROFEN 600 MG PO TABS: 600.0000 mg | ORAL_TABLET | Freq: Four times a day (QID) | ORAL | Status: DC

## 2015-10-04 MED ORDER — MEDROXYPROGESTERONE ACETATE 150 MG/ML IM SUSP: 150.0000 mg | INTRAMUSCULAR | Status: DC

## 2015-10-04 MED ORDER — MEDROXYPROGESTERONE ACETATE 150 MG/ML IM SUSP
150.0000 mg | Freq: Once | INTRAMUSCULAR | Status: AC
  Administered 2015-10-04: 150 mg via INTRAMUSCULAR
  Filled 2015-10-04: qty 1

## 2015-10-04 MED ORDER — OXYCODONE-ACETAMINOPHEN 5-325 MG PO TABS: 1.0000 | ORAL_TABLET | ORAL | Status: DC | PRN

## 2015-10-04 NOTE — Progress Notes (Signed)
D/C order from MD.  Reviewed d/c instructions and prescriptions with patient and answered any questions.  Patient d/c home with infant via wheelchair by nursing/auxillary. 

## 2015-10-04 NOTE — Discharge Instructions (Signed)
Discharge instructions:   Call office if you have any of the following: headache, visual changes, fever >100 F, chills, breast concerns, excessive vaginal bleeding, incision drainage or problems, leg pain or redness, depression or any other concerns.   Activity: Do not lift > 10 lbs for 6 weeks.  No intercourse or tampons for 6 weeks.  No driving for 1-2 weeks.  Vaginal Delivery, Care After Refer to this sheet in the next few weeks. These discharge instructions provide you with information on caring for yourself after delivery. Your health care provider may also give you specific instructions. Your treatment has been planned according to the most current medical practices available, but problems sometimes occur. Call your health care provider if you have any problems or questions after you go home. HOME CARE INSTRUCTIONS  Take over-the-counter or prescription medicines only as directed by your health care provider or pharmacist.  Do not drink alcohol, especially if you are taking medicine to relieve pain.  Do not chew or smoke tobacco.  Do not use illegal drugs.  Continue to use good perineal care. Good perineal care includes:  Wiping your perineum from front to back.  Keeping your perineum clean.  Do not use tampons or douche for the next 6 weeks.   Showers only, no tub baths for the next 6 weeks.   Wear a well-fitting bra that provides breast support.  Eat healthy foods.  Drink enough fluids to keep your urine clear or pale yellow.  Eat high-fiber foods such as whole grain cereals and breads, brown rice, beans, and fresh fruits and vegetables every day. These foods may help prevent or relieve constipation.  Follow your health care provider's recommendations regarding resumption of activities such as climbing stairs, driving, lifting, exercising, or traveling.  No sexual intercourse for at least the next 6 weeks, and then not until you feel ready.   Try to have someone help  you with your household activities and your newborn for at least a few days after you leave the hospital.  Rest as much as possible. Try to rest or take a nap when your newborn is sleeping.  Increase your activities gradually.  Keep all of your scheduled postpartum appointments. It is very important to keep your scheduled follow-up appointments. At these appointments, your health care provider will be checking to make sure that you are healing physically and emotionally. SEEK MEDICAL CARE IF:   You are passing large clots from your vagina. Save any clots to show your health care provider.  You have a foul smelling discharge from your vagina.  You have trouble urinating.  You are urinating frequently.  You have pain when you urinate.  You have a change in your bowel movements.  You have increasing redness, pain, or swelling near your vaginal incision (episiotomy) or vaginal tear.  You have pus draining from your episiotomy or vaginal tear.  Your episiotomy or vaginal tear is separating.  You have painful, hard, or reddened breasts.  You have a severe headache.  You have blurred vision or see spots.  You feel sad or depressed.  You have thoughts of hurting yourself or your newborn.  You have questions about your care, the care of your newborn, or medicines.  You are dizzy or light-headed.  You have a rash.  You have nausea or vomiting.  You are not breastfeeding and have not had a menstrual period by the 12th week after delivery.  You have a fever. SEEK IMMEDIATE MEDICAL CARE IF:  You have persistent pain.  You have chest pain.  You have shortness of breath.  You faint.  You have leg pain.  You have stomach pain.  Your vaginal bleeding saturates two or more sanitary pads in 1 hour.   This information is not intended to replace advice given to you by your health care provider. Make sure you discuss any questions you have with your health care provider.

## 2016-06-15 IMAGING — US US OB COMP LESS 14 WK
1 series · 14 of 28 positions shown · non-contrast
Comparison: None.

CLINICAL DATA: Pelvic pain for 3 weeks

EXAM:
OBSTETRIC <14 WK US AND TRANSVAGINAL OB US
TECHNIQUE: Both transabdominal and transvaginal ultrasound examinations were
performed for complete evaluation of the gestation as well as the
maternal uterus, adnexal regions, and pelvic cul-de-sac.
Transvaginal technique was performed to assess early pregnancy.

[Series 1: us ob comp less 14 wk · 0.17mm/px · 14 of 59 slices shown]
[im 3/59]
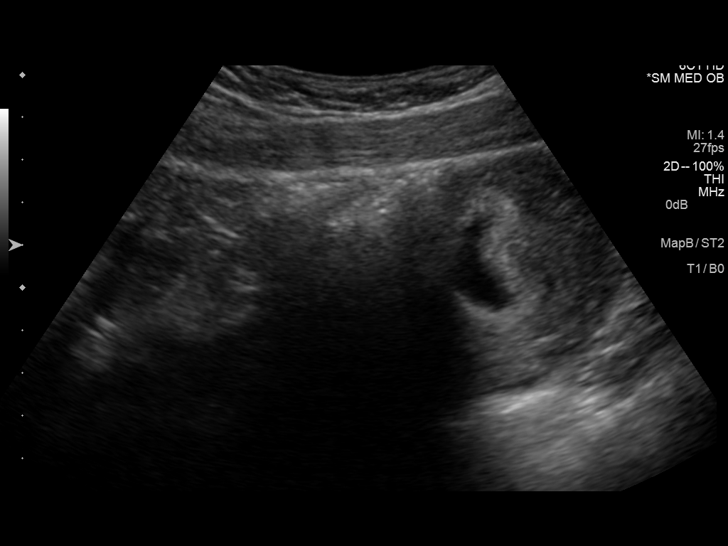
[im 7/59]
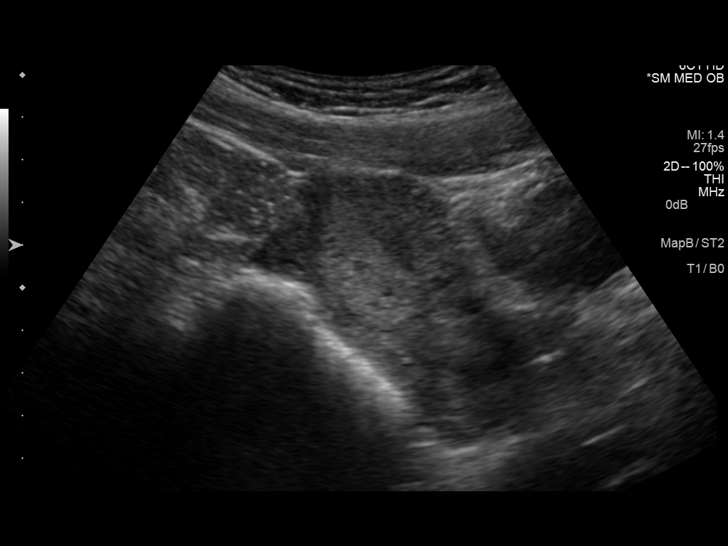
[im 11/59]
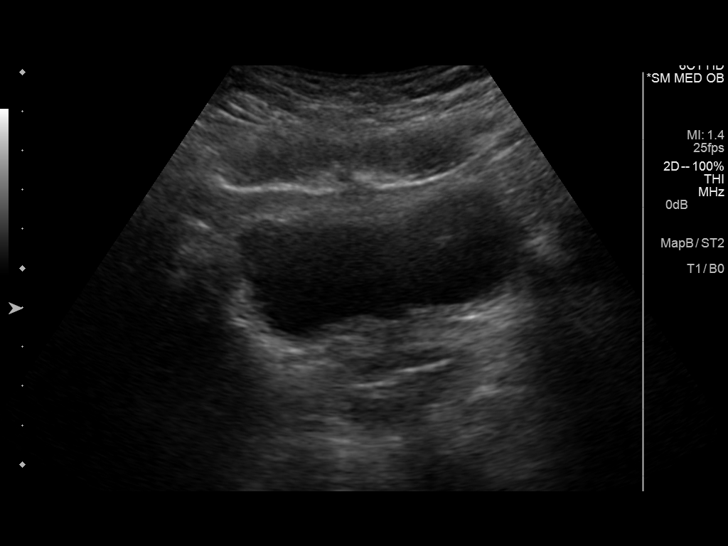
[im 16/59]
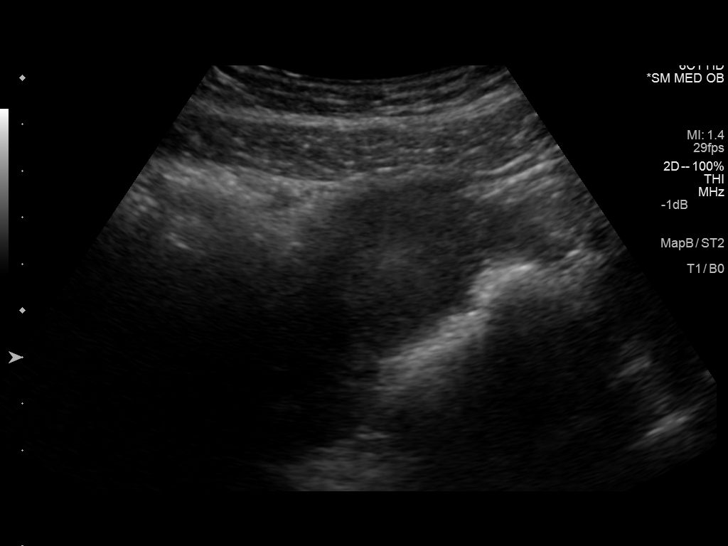
[im 20/59]
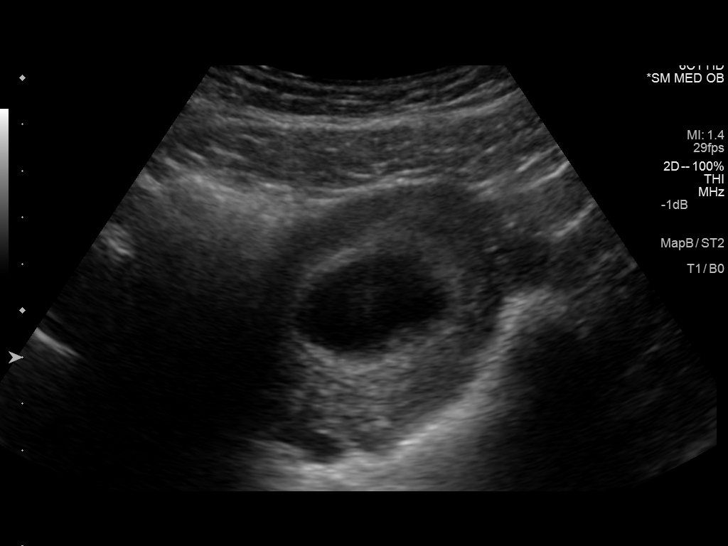
[im 24/59]
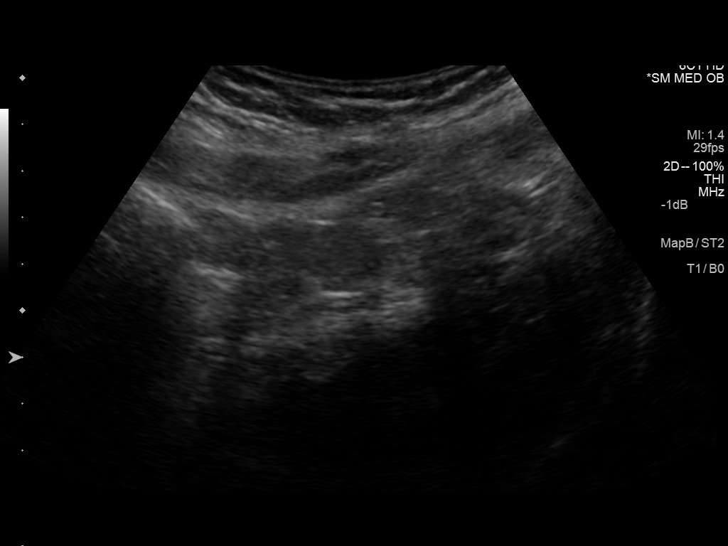
[im 28/59]
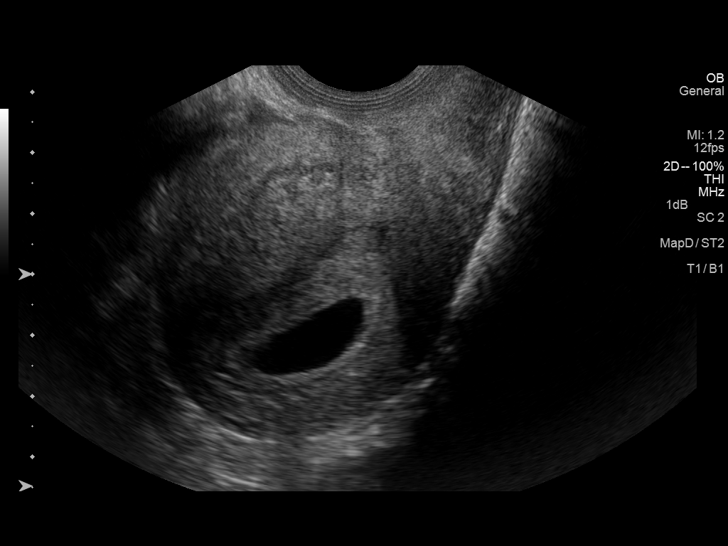
[im 33/59]
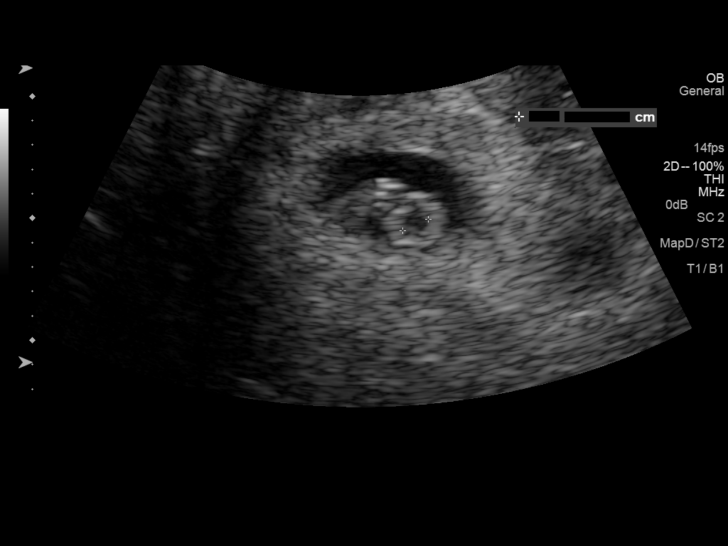
[im 37/59]
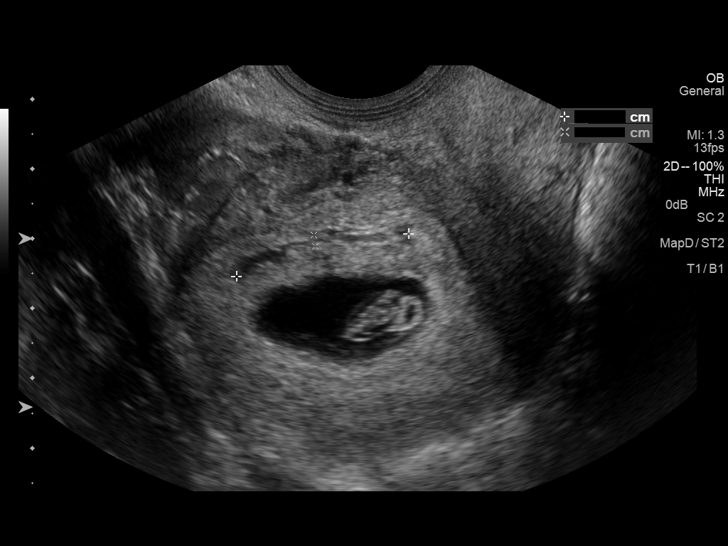
[im 41/59]
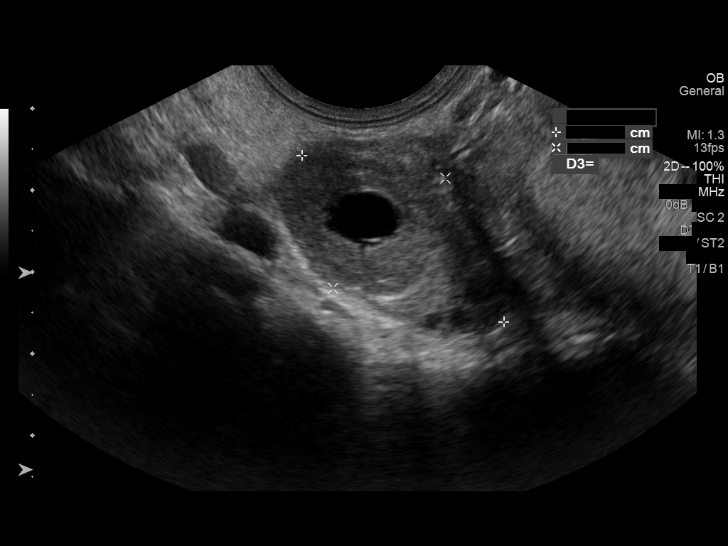
[im 46/59]
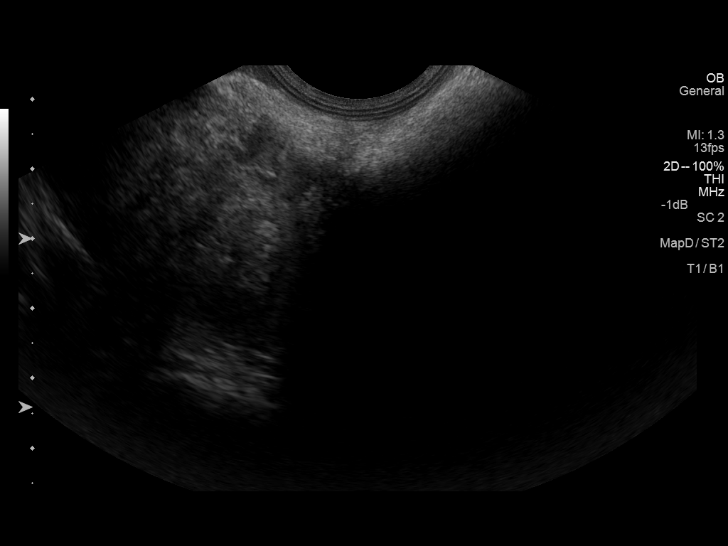
[im 50/59]
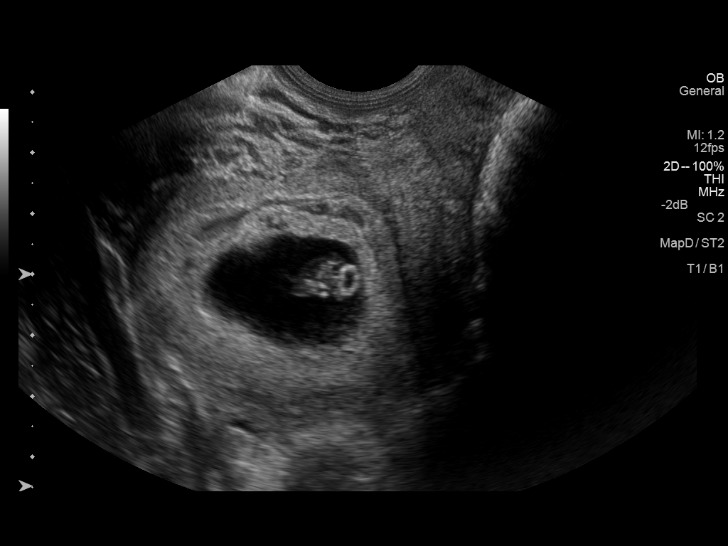
[im 54/59]
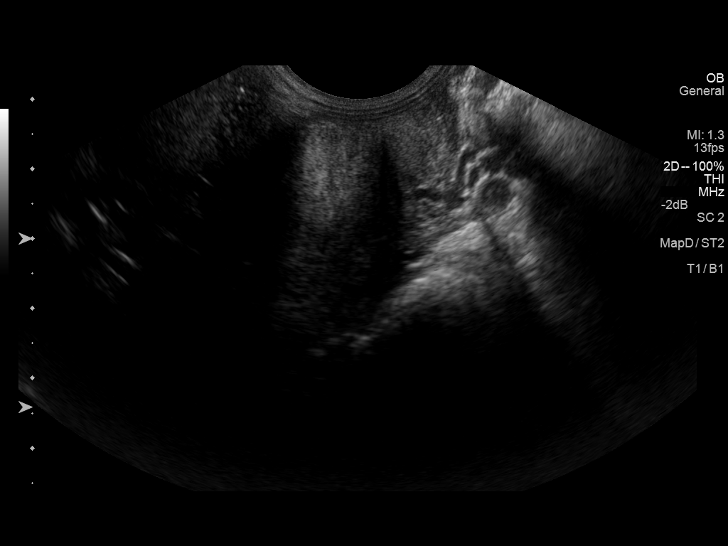
[im 59/59]
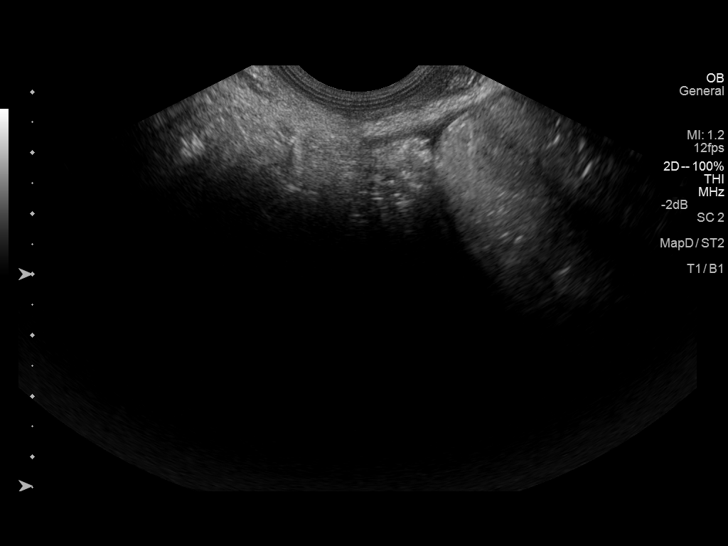

[14 of 28 positions shown; findings below may reference images not displayed]

FINDINGS: Intrauterine gestational sac: Single

Yolk sac:  Yes

Embryo:  Yes

Cardiac Activity: Yes

Heart Rate: 145  bpm

MSD:   mm    w     d

CRL:  9.0  mm   6 w   6 d                  US EDC: 10/06/2015

Maternal uterus/adnexae: Normal right ovary. Nonvisualization of the
left ovary. Small subchorionic hemorrhage. No free fluid.
IMPRESSION: Single living intrauterine gestation measuring 6 weeks 6 days by
crown-rump length. Small subchorionic hemorrhage.

## 2017-01-25 ENCOUNTER — Emergency Department
Admission: EM | Admit: 2017-01-25 | Discharge: 2017-01-25 | Disposition: A | Discharge status: Home / Self Care | Payer: Self-pay | Attending: Emergency Medicine | Admitting: Emergency Medicine

## 2017-01-25 ENCOUNTER — Other Ambulatory Visit: Payer: Self-pay

## 2017-01-25 ENCOUNTER — Encounter: Payer: Self-pay | Admitting: Emergency Medicine

## 2017-01-25 DIAGNOSIS — J029 Acute pharyngitis, unspecified: Secondary | ICD-10-CM | POA: Insufficient documentation

## 2017-01-25 DIAGNOSIS — Z79899 Other long term (current) drug therapy: Secondary | ICD-10-CM | POA: Insufficient documentation

## 2017-01-25 LAB — POCT RAPID STREP A: STREPTOCOCCUS, GROUP A SCREEN (DIRECT): NEGATIVE

## 2017-01-25 MED ORDER — DEXAMETHASONE SODIUM PHOSPHATE 10 MG/ML IJ SOLN
10.0000 mg | Freq: Once | INTRAMUSCULAR | Status: AC
  Administered 2017-01-25: 10 mg via INTRAMUSCULAR
  Filled 2017-01-25: qty 1

## 2017-01-25 MED ORDER — CLINDAMYCIN HCL 150 MG PO CAPS
300.0000 mg | ORAL_CAPSULE | Freq: Once | ORAL | Status: AC
  Administered 2017-01-25: 300 mg via ORAL
  Filled 2017-01-25: qty 2

## 2017-01-25 MED ORDER — MAGIC MOUTHWASH: 5.0000 mL | Freq: Three times a day (TID) | ORAL | 0 refills | Status: DC | PRN

## 2017-01-25 MED ORDER — MAGIC MOUTHWASH
10.0000 mL | Freq: Once | ORAL | Status: AC
  Administered 2017-01-25: 10 mL via ORAL
  Filled 2017-01-25: qty 10

## 2017-01-25 MED ORDER — CLINDAMYCIN HCL 300 MG PO CAPS: 300.0000 mg | ORAL_CAPSULE | Freq: Three times a day (TID) | ORAL | 0 refills | Status: DC

## 2017-01-25 NOTE — ED Notes (Signed)
Rapid Strep Negative EDP aware

## 2017-01-25 NOTE — ED Provider Notes (Signed)
[facility] Emergency Department Provider Note   ____________________________________________   First MD Initiated Contact with Patient 01/25/17 0214     (approximate)  I have reviewed the triage vital signs and the nursing notes.   HISTORY  Chief Complaint Sore Throat    HPI Kaitlyn Richmond is a 21 y.o. female who presents to the ED from home with a chief complaint of sore throat.  Patient reports a 2-day history of sore throat, bilateral earache; taking over-the-counter medications without relief of symptoms.  Denies associated fever, chills, cough, chest pain, shortness of breath, abdominal pain, nausea, vomiting, rash.  Denies recent travel or trauma.  Nothing makes her symptoms better or worse.   Past medical history None  Patient Active Problem List   Diagnosis Date Noted  . Indication for care in labor or delivery 10/02/2015    History reviewed. No pertinent surgical history.  Prior to Admission medications   Medication Sig Start Date End Date Taking? Authorizing Provider  clindamycin (CLEOCIN) 300 MG capsule Take 1 capsule (300 mg total) 3 (three) times daily by mouth. 01/25/17   Irean HongSung, Jade J, MD  ibuprofen (ADVIL,MOTRIN) 600 MG tablet Take 1 tablet (600 mg total) by mouth every 6 (six) hours. 10/04/15   Marta AntuBrothers, Tamara, CNM  magic mouthwash SOLN Take 5 mLs 3 (three) times daily as needed by mouth for mouth pain. 01/25/17   Irean HongSung, Jade J, MD  medroxyPROGESTERone (DEPO-PROVERA) 150 MG/ML injection Inject 1 mL (150 mg total) into the muscle every 3 (three) months. 10/04/15   Marta AntuBrothers, Tamara, CNM  oxyCODONE-acetaminophen (PERCOCET/ROXICET) 5-325 MG tablet Take 1 tablet by mouth every 4 (four) hours as needed (pain scale 4-7). 10/04/15   Marta AntuBrothers, Tamara, CNM    Allergies Penicillins and Shellfish allergy  No family history on file.  Social History Social History   Tobacco Use  . Smoking status: Never Smoker  . Smokeless tobacco: Never  Used  Substance Use Topics  . Alcohol use: No  . Drug use: Not on file    Review of Systems  Constitutional: No fever/chills. Eyes: No visual changes. ENT: Positive for sore throat and bilateral ear pain. Cardiovascular: Denies chest pain. Respiratory: Denies shortness of breath. Gastrointestinal: No abdominal pain.  No nausea, no vomiting.  No diarrhea.  No constipation. Genitourinary: Negative for dysuria. Musculoskeletal: Negative for back pain. Skin: Negative for rash. Neurological: Negative for headaches, focal weakness or numbness.   ____________________________________________   PHYSICAL EXAM:  VITAL SIGNS: ED Triage Vitals  Enc Vitals Group     BP 01/25/17 0206 120/68     Pulse Rate 01/25/17 0206 (!) 107     Resp 01/25/17 0206 20     Temp 01/25/17 0206 98.8 F (37.1 C)     Temp Source 01/25/17 0206 Oral     SpO2 01/25/17 0206 99 %     Weight 01/25/17 0204 141 lb (64 kg)     Height 01/25/17 0204 5\' 2"  (1.575 m)     Head Circumference --      Peak Flow --      Pain Score 01/25/17 0204 10     Pain Loc --      Pain Edu? --      Excl. in GC? --     Constitutional: Alert and oriented. Well appearing and in no acute distress. Eyes: Conjunctivae are normal. PERRL. EOMI. Head: Atraumatic. Ears: Some cerumen bilaterally; otherwise TMs dull without erythema or perforation. Nose: No congestion/rhinnorhea. Mouth/Throat: Mucous membranes  are moist.  Oropharynx moderately erythematous with bilateral and symmetrical tonsillar swelling.  No exudates or peritonsillar abscess.  Hoarse voice.  There is no muffled voice or drooling. Neck: No stridor.  Supple neck without meningismus. Hematological/Lymphatic/Immunilogical:Shotty anterior cervical lymphadenopathy. Cardiovascular: Normal rate, regular rhythm. Grossly normal heart sounds.  Good peripheral circulation. Respiratory: Normal respiratory effort.  No retractions. Lungs CTAB. Gastrointestinal: Soft and nontender. No  distention. No abdominal bruits. No CVA tenderness. Musculoskeletal: No lower extremity tenderness nor edema.  No joint effusions. Neurologic:  Normal speech and language. No gross focal neurologic deficits are appreciated. No gait instability. Skin:  Skin is warm, dry and intact. No rash noted.  No petechiae. Psychiatric: Mood and affect are normal. Speech and behavior are normal.  ____________________________________________   LABS (all labs ordered are listed, but only abnormal results are displayed)  Labs Reviewed  CULTURE, GROUP A STREP [facility]  POCT RAPID STREP A   ____________________________________________  EKG  None ____________________________________________  RADIOLOGY  No results found.  ____________________________________________   PROCEDURES  Procedure(s) performed: None  Procedures  Critical Care performed: No  ____________________________________________   INITIAL IMPRESSION / ASSESSMENT AND PLAN / ED COURSE  As part of my medical decision making, I reviewed the following data within the electronic MEDICAL RECORD NUMBER Nursing notes reviewed and incorporated, Labs reviewed  and Notes from prior ED visits.   21 year old otherwise healthy female who presents with sore throat and bilateral earache for 2 days.  Rapid strep test is negative. Will administer IM Decadron, antibiotic, Magic mouthwash and patient will follow-up with her PCP as needed.  Strict return precautions given.  Patient verbalizes understanding and agrees with plan of care.      ____________________________________________   FINAL CLINICAL IMPRESSION(S) / ED DIAGNOSES  Final diagnoses:  Acute pharyngitis, unspecified etiology     ED Discharge Orders        Ordered    clindamycin (CLEOCIN) 300 MG capsule  3 times daily     01/25/17 0257    magic mouthwash SOLN  3 times daily PRN     01/25/17 0257       Note:  This document was prepared using Dragon voice recognition  software and may include unintentional dictation errors.    Irean HongSung, Jade J, MD 01/25/17 [phone]

## 2017-01-25 NOTE — ED Triage Notes (Signed)
Patient ambulatory to triage with steady gait, without difficulty or distress noted, muffled voice; c/o right sided throat pain and earache x 2 days; taking OTC meds without relief

## 2017-01-25 NOTE — ED Notes (Signed)

## 2017-01-25 NOTE — Discharge Instructions (Signed)
1.  Take antibiotic as prescribed (clindamycin 300 mg 3 times daily x 10 days). 2.  You may use Magic mouthwash as needed for discomfort. 3.  Return to the ER for worsening symptoms, persistent vomiting, difficulty breathing or other concerns.

## 2017-01-27 LAB — CULTURE, GROUP A STREP (THRC)

## 2017-08-13 ENCOUNTER — Other Ambulatory Visit: Payer: Self-pay

## 2017-08-13 ENCOUNTER — Emergency Department
Admission: EM | Admit: 2017-08-13 | Discharge: 2017-08-13 | Disposition: A | Discharge status: Home / Self Care | Payer: Self-pay | Attending: Emergency Medicine | Admitting: Emergency Medicine

## 2017-08-13 ENCOUNTER — Encounter: Payer: Self-pay | Admitting: Emergency Medicine

## 2017-08-13 DIAGNOSIS — O26891 Other specified pregnancy related conditions, first trimester: Secondary | ICD-10-CM

## 2017-08-13 DIAGNOSIS — R109 Unspecified abdominal pain: Secondary | ICD-10-CM

## 2017-08-13 DIAGNOSIS — Z3490 Encounter for supervision of normal pregnancy, unspecified, unspecified trimester: Secondary | ICD-10-CM

## 2017-08-13 DIAGNOSIS — Z3A01 Less than 8 weeks gestation of pregnancy: Secondary | ICD-10-CM | POA: Insufficient documentation

## 2017-08-13 DIAGNOSIS — Z79899 Other long term (current) drug therapy: Secondary | ICD-10-CM | POA: Insufficient documentation

## 2017-08-13 DIAGNOSIS — R110 Nausea: Secondary | ICD-10-CM

## 2017-08-13 DIAGNOSIS — O2341 Unspecified infection of urinary tract in pregnancy, first trimester: Secondary | ICD-10-CM | POA: Insufficient documentation

## 2017-08-13 LAB — CBC
HCT: 39.5 % (ref 35.0–47.0)
Hemoglobin: 13 g/dL (ref 12.0–16.0)
MCH: 27.7 pg (ref 26.0–34.0)
MCHC: 32.8 g/dL (ref 32.0–36.0)
MCV: 84.3 fL (ref 80.0–100.0)
Platelets: 344 10*3/uL (ref 150–440)
RBC: 4.69 MIL/uL (ref 3.80–5.20)
RDW: 15.7 % — AB (ref 11.5–14.5)
WBC: 6.4 10*3/uL (ref 3.6–11.0)

## 2017-08-13 LAB — COMPREHENSIVE METABOLIC PANEL
ALBUMIN: 4.3 g/dL (ref 3.5–5.0)
ALT: 33 U/L (ref 14–54)
AST: 30 U/L (ref 15–41)
Alkaline Phosphatase: 49 U/L (ref 38–126)
Anion gap: 9 (ref 5–15)
BILIRUBIN TOTAL: 0.9 mg/dL (ref 0.3–1.2)
BUN: 12 mg/dL (ref 6–20)
CO2: 26 mmol/L (ref 22–32)
CREATININE: 0.74 mg/dL (ref 0.44–1.00)
Calcium: 9.3 mg/dL (ref 8.9–10.3)
Chloride: 101 mmol/L (ref 101–111)
GFR calc Af Amer: >60 mL/min (ref >60)
GLUCOSE: 89 mg/dL (ref 65–99)
Potassium: 3.5 mmol/L (ref 3.5–5.1)
Sodium: 136 mmol/L (ref 135–145)
TOTAL PROTEIN: 8.9 g/dL — AB (ref 6.5–8.1)

## 2017-08-13 LAB — URINALYSIS, COMPLETE (UACMP) WITH MICROSCOPIC
Bacteria, UA: NONE SEEN
Bilirubin Urine: NEGATIVE
Glucose, UA: NEGATIVE mg/dL
KETONES UR: NEGATIVE mg/dL
Nitrite: NEGATIVE
Protein, ur: NEGATIVE mg/dL
Specific Gravity, Urine: 1.025 (ref 1.005–1.030)
pH: 7 (ref 5.0–8.0)

## 2017-08-13 LAB — HCG, QUANTITATIVE, PREGNANCY: HCG, BETA CHAIN, QUANT, S: 2330 m[IU]/mL — AB (ref <5)

## 2017-08-13 MED ORDER — ONDANSETRON 4 MG PO TBDP: ORAL_TABLET | ORAL | 0 refills | Status: DC

## 2017-08-13 MED ORDER — NITROFURANTOIN MONOHYD MACRO 100 MG PO CAPS: 100.0000 mg | ORAL_CAPSULE | Freq: Two times a day (BID) | ORAL | 0 refills | Status: AC

## 2017-08-13 NOTE — Discharge Instructions (Signed)
As we discussed, your lab work-up was reassuring today.  You might have a mild urinary tract infection so we gave you a course of antibiotics.  Please take the full course of treatment (1 week).  The blood pregnancy test suggests that you are no more than [redacted] weeks pregnant, so an ultrasound is unlikely to be beneficial.  Please follow-up with the health department or with [facility] or another OB/GYN provider to schedule an appropriate follow-up prenatal visit.  In the meantime you can start taking over-the-counter prenatal vitamins.  Return to the emergency department if you develop new or worsening symptoms that concern you, such as vaginal bleeding or severe pain.

## 2017-08-13 NOTE — ED Provider Notes (Signed)
[facility] Emergency Department Provider Note  ____________________________________________   First MD Initiated Contact with Patient 08/13/17 1924     (approximate)  I have reviewed the triage vital signs and the nursing notes.   HISTORY  Chief Complaint Abdominal Pain    HPI Kaitlyn Richmond is a 22 y.o. female G3P2 at early stage of pregnancy who presents for evaluation of nausea and some left-sided pain.  The pain is mild and the nausea is mild and not accompanied with any vomiting or diarrhea.  She just recently found out that she was pregnant.  She went to the health department today but she reports that they did not tell her anything except that she was pregnant.  She came here to find out how far along she is.  She reports that she was told that she has to come through the emergency department first before she can get a follow-up appointment with an OB/GYN for prenatal care.  She is in no distress reports no pain at this time.  She has 2 children at home and says that she feels similar to the way she felt when she was pregnant with them.  She has had no vaginal bleeding.  She denies fever/chills, chest pain, shortness of breath, vomiting, vaginal bleeding, dysuria.  The pain is high up on her left side, more in the lower rib area, and is worsened by moving around.  It is mild.  History reviewed. No pertinent past medical history.  Patient Active Problem List   Diagnosis Date Noted  . Indication for care in labor or delivery 10/02/2015    History reviewed. No pertinent surgical history.  Prior to Admission medications   Medication Sig Start Date End Date Taking? Authorizing Provider  clindamycin (CLEOCIN) 300 MG capsule Take 1 capsule (300 mg total) 3 (three) times daily by mouth. 01/25/17   Irean Hong, MD  ibuprofen (ADVIL,MOTRIN) 600 MG tablet Take 1 tablet (600 mg total) by mouth every 6 (six) hours. 10/04/15   Marta Antu, CNM  magic  mouthwash SOLN Take 5 mLs 3 (three) times daily as needed by mouth for mouth pain. 01/25/17   Irean Hong, MD  medroxyPROGESTERone (DEPO-PROVERA) 150 MG/ML injection Inject 1 mL (150 mg total) into the muscle every 3 (three) months. 10/04/15   Brothers, Delaney Meigs, CNM  nitrofurantoin, macrocrystal-monohydrate, (MACROBID) 100 MG capsule Take 1 capsule (100 mg total) by mouth 2 (two) times daily for 7 days. 08/13/17 08/20/17  Loleta Rose, MD  ondansetron (ZOFRAN ODT) 4 MG disintegrating tablet Allow 1-2 tablets to dissolve in your mouth every 8 hours as needed for nausea/vomiting 08/13/17   Loleta Rose, MD  oxyCODONE-acetaminophen (PERCOCET/ROXICET) 5-325 MG tablet Take 1 tablet by mouth every 4 (four) hours as needed (pain scale 4-7). 10/04/15   Marta Antu, CNM    Allergies Penicillins and Shellfish allergy  No family history on file.  Social History Social History   Tobacco Use  . Smoking status: Never Smoker  . Smokeless tobacco: Never Used  Substance Use Topics  . Alcohol use: No  . Drug use: Not on file    Review of Systems Constitutional: No fever/chills Eyes: No visual changes. ENT: No sore throat. Cardiovascular: Denies chest pain. Respiratory: Denies shortness of breath. Gastrointestinal: Mild left-sided pain as described above with nausea, no vomiting. Genitourinary: No vaginal bleeding.  Negative for dysuria. Musculoskeletal: Negative for neck pain.  Negative for back pain. Integumentary: Negative for rash. Neurological: Negative for headaches, focal  weakness or numbness.   ____________________________________________   PHYSICAL EXAM:  VITAL SIGNS: ED Triage Vitals  Enc Vitals Group     BP 08/13/17 1604 116/73     Pulse Rate 08/13/17 1604 84     Resp 08/13/17 1604 16     Temp 08/13/17 1604 98.6 F (37 C)     Temp Source 08/13/17 1604 Oral     SpO2 08/13/17 1604 100 %     Weight 08/13/17 1605 63.5 kg (140 lb)     Height 08/13/17 1605 1.575 m ( )      Head Circumference --      Peak Flow --      Pain Score 08/13/17 1605 0     Pain Loc --      Pain Edu? --      Excl. in GC? --     Constitutional: Alert and oriented. Well appearing and in no acute distress. Eyes: Conjunctivae are normal.  Head: Atraumatic. Nose: No congestion/rhinnorhea. Mouth/Throat: Mucous membranes are moist. Neck: No stridor.  No meningeal signs.   Cardiovascular: Normal rate, regular rhythm. Good peripheral circulation. Grossly normal heart sounds. Respiratory: Normal respiratory effort.  No retractions. Lungs CTAB. Gastrointestinal: Soft and nontender. No distention.  Genitourinary: Deferred given lack of symptoms today Musculoskeletal: No lower extremity tenderness nor edema. No gross deformities of extremities. Neurologic:  Normal speech and language. No gross focal neurologic deficits are appreciated.  Skin:  Skin is warm, dry and intact. No rash noted. *Psychiatric: Mood and affect are normal. Speech and behavior are normal.  ____________________________________________   LABS (all labs ordered are listed, but only abnormal results are displayed)  Labs Reviewed  COMPREHENSIVE METABOLIC PANEL - Abnormal; Notable for the following components:      Result Value   Total Protein 8.9 (*)    All other components within normal limits  CBC - Abnormal; Notable for the following components:   RDW 15.7 (*)    All other components within normal limits  URINALYSIS, COMPLETE (UACMP) WITH MICROSCOPIC - Abnormal; Notable for the following components:   Color, Urine YELLOW (*)    APPearance CLOUDY (*)    Hgb urine dipstick SMALL (*)    Leukocytes, UA LARGE (*)    All other components within normal limits  HCG, QUANTITATIVE, PREGNANCY - Abnormal; Notable for the following components:   hCG, Beta Chain, Quant, S 2,330 (*)    All other components within normal limits   ____________________________________________  EKG  None - EKG not ordered by ED  physician ____________________________________________  RADIOLOGY   ED MD interpretation: No indication for imaging  Official radiology report(s): No results found.  ____________________________________________   PROCEDURES  Critical Care performed: No   Procedure(s) performed:   Procedures   ____________________________________________   INITIAL IMPRESSION / ASSESSMENT AND PLAN / ED COURSE  As part of my medical decision making, I reviewed the following data within the electronic MEDICAL RECORD NUMBER Nursing notes reviewed and incorporated, Labs reviewed  and Notes from prior ED visits    Differential diagnosis includes, but is not limited to, musculoskeletal pain, nausea associated with early pregnancy, ectopic pregnancy, nonviable pregnancy.  However the patient is in absolutely no distress and has no tenderness to palpation.  After a somewhat confusing discussion regarding her symptoms and her visit here after going to the health department, it became apparent that she thought she had to come to the emergency department in order to schedule a follow-up visit with the health department  or with an OB/GYN provider.  I assured her that that was not the case, and that not every pregnant woman comes through the emergency department in order to get to a prenatal provider, and this surprised her, as she states that that is what happened with both of her prior pregnancies.  She is having no vaginal bleeding and no distress.  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  CMP is within normal limits, CBC is normal.  hCG is only 2330 which indicates a pregnancy of 3 weeks or less.  There is no benefit to an ultrasound at this time, particularly given no other symptoms.  I encouraged her to follow-up with a prenatal appointment with a  provider of her choice at the next available opportunity.  She states that she understands and agrees.  I will also provide a prescription for Zofran given that she is reporting some nausea although she has not had any vomiting.     ____________________________________________  FINAL CLINICAL IMPRESSION(S) / ED DIAGNOSES  Final diagnoses:  Early stage of pregnancy  Abdominal pain during pregnancy in first trimester  Nausea  Urinary tract infection in mother during first trimester of pregnancy     MEDICATIONS GIVEN DURING THIS VISIT:  Medications - No data to display   ED Discharge Orders        Ordered    nitrofurantoin, macrocrystal-monohydrate, (MACROBID) 100 MG capsule  2 times daily     08/13/17 1948    ondansetron (ZOFRAN ODT) 4 MG disintegrating tablet     08/13/17 1953       Note:  This document was prepared using Dragon voice recognition software and may include unintentional dictation errors.    Loleta Rose, MD 08/13/17 [phone]

## 2017-08-13 NOTE — ED Triage Notes (Signed)
Pregnant . Left lower abd pain started Saturday.  Working long hours.

## 2017-08-15 LAB — URINE CULTURE: SPECIAL REQUESTS: NORMAL

## 2017-09-09 DIAGNOSIS — Z118 Encounter for screening for other infectious and parasitic diseases: Secondary | ICD-10-CM | POA: Diagnosis not present

## 2017-09-09 DIAGNOSIS — Z1151 Encounter for screening for human papillomavirus (HPV): Secondary | ICD-10-CM | POA: Diagnosis not present

## 2017-09-09 DIAGNOSIS — Z6821 Body mass index (BMI) 21.0-21.9, adult: Secondary | ICD-10-CM | POA: Diagnosis not present

## 2017-09-09 DIAGNOSIS — Z3169 Encounter for other general counseling and advice on procreation: Secondary | ICD-10-CM | POA: Diagnosis not present

## 2017-09-09 DIAGNOSIS — Z01419 Encounter for gynecological examination (general) (routine) without abnormal findings: Secondary | ICD-10-CM | POA: Diagnosis not present

## 2017-09-17 DIAGNOSIS — Z3009 Encounter for other general counseling and advice on contraception: Secondary | ICD-10-CM | POA: Diagnosis not present

## 2017-09-17 DIAGNOSIS — R7989 Other specified abnormal findings of blood chemistry: Secondary | ICD-10-CM | POA: Diagnosis not present

## 2018-02-20 ENCOUNTER — Encounter: Payer: Self-pay | Admitting: Emergency Medicine

## 2018-02-20 ENCOUNTER — Emergency Department: Payer: Self-pay

## 2018-02-20 DIAGNOSIS — M791 Myalgia, unspecified site: Secondary | ICD-10-CM | POA: Insufficient documentation

## 2018-02-20 DIAGNOSIS — Z5321 Procedure and treatment not carried out due to patient leaving prior to being seen by health care provider: Secondary | ICD-10-CM | POA: Insufficient documentation

## 2018-02-20 LAB — INFLUENZA PANEL BY PCR (TYPE A & B)
Influenza A By PCR: NEGATIVE
Influenza B By PCR: NEGATIVE

## 2018-02-20 NOTE — ED Triage Notes (Signed)
Patient c/o generalized body aches, cough, congestion, and headache beginning today. Patient did not have flu shot.

## 2018-02-21 ENCOUNTER — Emergency Department: Payer: Self-pay

## 2018-02-21 ENCOUNTER — Emergency Department
Admission: EM | Admit: 2018-02-21 | Discharge: 2018-02-21 | Disposition: A | Discharge status: Home / Self Care | Payer: Self-pay | Attending: Emergency Medicine | Admitting: Emergency Medicine

## 2018-02-21 NOTE — ED Notes (Signed)
Pt seen leaving the lobby at this time and entering a vehicle that pulled into the front drop-off circle without notifying staff of intent to leave. Pt will be removed from the waiting room list, but not d/c'd in case she returns.

## 2018-02-21 NOTE — ED Notes (Signed)
Pt has not returned at this time.

## 2018-02-21 NOTE — ED Notes (Signed)
Pt has not returned to ED. Pt will be d/c'd as LWBS after Triage at this time.

## 2018-03-19 NOTE — L&D Delivery Note (Addendum)
Delivery Note   Patient Name: Lindsay Rojas DOB: 18-Jan-1996 MRN: [id number]  Date of admission: 02/28/2019 Delivering MD: Noralyn Pick  Date of delivery:02/28/19 Type of delivery: SVD  Newborn Data: Live born female  Birth Weight:   APGAR: 58, 48  Newborn Delivery   Birth date/time: 02/28/2019 20:18:00 Delivery type: Vaginal, Spontaneous      Lindsay Rojas, 23 y.o., @ [redacted]w[redacted]d,  G1P0, who was admitted for spontaneous labor. I was called to the room when she progressed +2+ station in the second stage of labor. There was fetal decel with a Cat 2 strip noted upon entering the room, pt had progressed from 5.5cm to complete. NICU was called to be present for the delivery. She pushed for 15/min.  She delivered a viable infant, cephalic and restituted to the LOT with a right compound hand position over an intact perineum.  A nuchal cord   was not identified. The baby was placed on maternal abdomen while initial step of NRP were perfmored (Dry, Stimulated, and warmed). Hat placed on baby for thermoregulation. Delayed cord clamping was performed for 3.5 minutes.  Cord double clamped and cut.  Cord cut by father. Apgar scores were 8 and 9. Prophylactic Pitocin was started in the third stage of labor for active management. The placenta delivered spontaneously, shultz, with a 3 vessel cord and was left in room for parents to take home.  An examination of the vaginal vault and cervix was free from lacerations. The uterus was firm, bleeding stable.   Placenta was left in room per pts request, they wanted to take it home and umbilical artery blood gas were not sent.  There were no complications during the procedure.  Mom and baby skin to skin following delivery. Left in stable condition.  Maternal Info: Anesthesia: Epidural Episiotomy: no Lacerations:  no Suture Repair: no Est. Blood Loss (mL):  200  Newborn Info:  Baby Sex: female Circumcision: out pt by urology Babies Name: Izola Price  APGAR (1 MIN):   8 APGAR (5 MINS): 9    Mom to postpartum.  Baby to Couplet care / Skin to Skin.  DR Alesia Richards aware of delivery.    [place], [place], NP-C 02/28/19 8:37 PM

## 2018-03-31 LAB — HM HIV SCREENING LAB: HM HIV Screening: NEGATIVE

## 2018-05-09 DIAGNOSIS — E663 Overweight: Secondary | ICD-10-CM | POA: Insufficient documentation

## 2018-07-09 DIAGNOSIS — Z3200 Encounter for pregnancy test, result unknown: Secondary | ICD-10-CM | POA: Diagnosis not present

## 2018-07-15 DIAGNOSIS — Z3201 Encounter for pregnancy test, result positive: Secondary | ICD-10-CM | POA: Diagnosis not present

## 2018-07-23 DIAGNOSIS — Z3401 Encounter for supervision of normal first pregnancy, first trimester: Secondary | ICD-10-CM | POA: Diagnosis not present

## 2018-07-30 DIAGNOSIS — Z3401 Encounter for supervision of normal first pregnancy, first trimester: Secondary | ICD-10-CM | POA: Diagnosis not present

## 2018-07-30 DIAGNOSIS — Z3400 Encounter for supervision of normal first pregnancy, unspecified trimester: Secondary | ICD-10-CM | POA: Diagnosis not present

## 2018-07-30 DIAGNOSIS — Z118 Encounter for screening for other infectious and parasitic diseases: Secondary | ICD-10-CM | POA: Diagnosis not present

## 2018-07-30 DIAGNOSIS — Z3201 Encounter for pregnancy test, result positive: Secondary | ICD-10-CM | POA: Diagnosis not present

## 2018-07-30 DIAGNOSIS — E0590 Thyrotoxicosis, unspecified without thyrotoxic crisis or storm: Secondary | ICD-10-CM | POA: Diagnosis not present

## 2018-07-30 DIAGNOSIS — Z3689 Encounter for other specified antenatal screening: Secondary | ICD-10-CM | POA: Diagnosis not present

## 2018-07-30 DIAGNOSIS — Z01419 Encounter for gynecological examination (general) (routine) without abnormal findings: Secondary | ICD-10-CM | POA: Diagnosis not present

## 2018-08-06 DIAGNOSIS — E0590 Thyrotoxicosis, unspecified without thyrotoxic crisis or storm: Secondary | ICD-10-CM | POA: Diagnosis not present

## 2018-08-06 DIAGNOSIS — Z331 Pregnant state, incidental: Secondary | ICD-10-CM | POA: Diagnosis not present

## 2018-08-29 DIAGNOSIS — R350 Frequency of micturition: Secondary | ICD-10-CM | POA: Diagnosis not present

## 2018-08-29 DIAGNOSIS — N898 Other specified noninflammatory disorders of vagina: Secondary | ICD-10-CM | POA: Diagnosis not present

## 2018-08-29 DIAGNOSIS — E0590 Thyrotoxicosis, unspecified without thyrotoxic crisis or storm: Secondary | ICD-10-CM | POA: Diagnosis not present

## 2018-08-29 DIAGNOSIS — Z3400 Encounter for supervision of normal first pregnancy, unspecified trimester: Secondary | ICD-10-CM | POA: Diagnosis not present

## 2018-08-29 DIAGNOSIS — B373 Candidiasis of vulva and vagina: Secondary | ICD-10-CM | POA: Diagnosis not present

## 2018-08-29 DIAGNOSIS — O26892 Other specified pregnancy related conditions, second trimester: Secondary | ICD-10-CM | POA: Diagnosis not present

## 2019-02-22 ENCOUNTER — Inpatient Hospital Stay (HOSPITAL_COMMUNITY)
Admission: AD | Admit: 2019-02-22 | Payer: Medicaid Other | Source: Home / Self Care | Admitting: Obstetrics and Gynecology

## 2019-02-28 ENCOUNTER — Encounter (HOSPITAL_COMMUNITY): Payer: Self-pay | Admitting: Obstetrics & Gynecology

## 2019-02-28 ENCOUNTER — Inpatient Hospital Stay (HOSPITAL_COMMUNITY): Payer: Medicaid Other | Admitting: Anesthesiology

## 2019-02-28 ENCOUNTER — Inpatient Hospital Stay (HOSPITAL_COMMUNITY)
Admission: AD | Admit: 2019-02-28 | Discharge: 2019-03-02 | DRG: 807 | Disposition: A | Discharge status: Home / Self Care | Payer: Medicaid Other | Attending: Obstetrics & Gynecology | Admitting: Obstetrics & Gynecology

## 2019-02-28 ENCOUNTER — Other Ambulatory Visit: Payer: Self-pay

## 2019-02-28 DIAGNOSIS — O26893 Other specified pregnancy related conditions, third trimester: Secondary | ICD-10-CM | POA: Diagnosis present

## 2019-02-28 DIAGNOSIS — Z20828 Contact with and (suspected) exposure to other viral communicable diseases: Secondary | ICD-10-CM | POA: Diagnosis present

## 2019-02-28 DIAGNOSIS — O480 Post-term pregnancy: Secondary | ICD-10-CM

## 2019-02-28 DIAGNOSIS — Z3A40 40 weeks gestation of pregnancy: Secondary | ICD-10-CM | POA: Diagnosis not present

## 2019-02-28 HISTORY — DX: Other specified health status: Z78.9

## 2019-02-28 LAB — RESPIRATORY PANEL BY RT PCR (FLU A&B, COVID)
Influenza A by PCR: NEGATIVE
Influenza B by PCR: NEGATIVE
SARS Coronavirus 2 by RT PCR: NEGATIVE

## 2019-02-28 LAB — ABO/RH: ABO/RH(D): AB POS

## 2019-02-28 LAB — CBC
HCT: 35.7 % — ABNORMAL LOW (ref 36.0–46.0)
Hemoglobin: 11.6 g/dL — ABNORMAL LOW (ref 12.0–15.0)
MCH: 27 pg (ref 26.0–34.0)
MCHC: 32.5 g/dL (ref 30.0–36.0)
MCV: 83.2 fL (ref 80.0–100.0)
Platelets: 208 10*3/uL (ref 150–400)
RBC: 4.29 MIL/uL (ref 3.87–5.11)
RDW: 13.6 % (ref 11.5–15.5)
WBC: 14.9 10*3/uL — ABNORMAL HIGH (ref 4.0–10.5)
nRBC: 0 % (ref 0.0–0.2)

## 2019-02-28 LAB — TYPE AND SCREEN
ABO/RH(D): AB POS
Antibody Screen: NEGATIVE

## 2019-02-28 LAB — TSH: TSH: 0.401 u[IU]/mL (ref 0.350–4.500)

## 2019-02-28 LAB — T4, FREE: Free T4: 0.85 ng/dL (ref 0.61–1.12)

## 2019-02-28 MED ORDER — LIDOCAINE HCL (PF) 1 % IJ SOLN: 30.0000 mL | INTRAMUSCULAR | Status: DC | PRN

## 2019-02-28 MED ORDER — DIPHENHYDRAMINE HCL 25 MG PO CAPS: 25.0000 mg | ORAL_CAPSULE | Freq: Four times a day (QID) | ORAL | Status: DC | PRN

## 2019-02-28 MED ORDER — OXYTOCIN 40 UNITS IN NORMAL SALINE INFUSION - SIMPLE MED
2.5000 [IU]/h | INTRAVENOUS | Status: DC
  Filled 2019-02-28: qty 1000

## 2019-02-28 MED ORDER — LACTATED RINGERS IV SOLN
INTRAVENOUS | Status: DC
  Administered 2019-02-28: 17:00:00 via INTRAVENOUS

## 2019-02-28 MED ORDER — FENTANYL-BUPIVACAINE-NACL 0.5-0.125-0.9 MG/250ML-% EP SOLN: 12.0000 mL/h | EPIDURAL | Status: DC | PRN

## 2019-02-28 MED ORDER — SODIUM CHLORIDE (PF) 0.9 % IJ SOLN
INTRAMUSCULAR | Status: DC | PRN
  Administered 2019-02-28: 12 mL/h via EPIDURAL

## 2019-02-28 MED ORDER — ONDANSETRON HCL 4 MG/2ML IJ SOLN: 4.0000 mg | INTRAMUSCULAR | Status: DC | PRN

## 2019-02-28 MED ORDER — ONDANSETRON HCL 4 MG/2ML IJ SOLN: 4.0000 mg | Freq: Four times a day (QID) | INTRAMUSCULAR | Status: DC | PRN

## 2019-02-28 MED ORDER — LACTATED RINGERS IV SOLN
500.0000 mL | INTRAVENOUS | Status: DC | PRN
  Administered 2019-02-28: 20:00:00 500 mL via INTRAVENOUS
  Administered 2019-02-28: 1000 mL via INTRAVENOUS

## 2019-02-28 MED ORDER — OXYCODONE-ACETAMINOPHEN 5-325 MG PO TABS: 2.0000 | ORAL_TABLET | ORAL | Status: DC | PRN

## 2019-02-28 MED ORDER — OXYTOCIN BOLUS FROM INFUSION
500.0000 mL | Freq: Once | INTRAVENOUS | Status: AC
  Administered 2019-02-28: 500 mL via INTRAVENOUS

## 2019-02-28 MED ORDER — WITCH HAZEL-GLYCERIN EX PADS: 1.0000 "application " | MEDICATED_PAD | CUTANEOUS | Status: DC | PRN

## 2019-02-28 MED ORDER — FENTANYL CITRATE (PF) 100 MCG/2ML IJ SOLN: 50.0000 ug | INTRAMUSCULAR | Status: DC | PRN

## 2019-02-28 MED ORDER — SENNOSIDES-DOCUSATE SODIUM 8.6-50 MG PO TABS
2.0000 | ORAL_TABLET | ORAL | Status: DC
  Administered 2019-02-28 – 2019-03-01 (×2): 2 via ORAL
  Filled 2019-02-28 (×2): qty 2

## 2019-02-28 MED ORDER — TETANUS-DIPHTH-ACELL PERTUSSIS 5-2.5-18.5 LF-MCG/0.5 IM SUSP: 0.5000 mL | Freq: Once | INTRAMUSCULAR | Status: DC

## 2019-02-28 MED ORDER — LIDOCAINE HCL (PF) 1 % IJ SOLN
INTRAMUSCULAR | Status: DC | PRN
  Administered 2019-02-28 (×2): 4 mL via EPIDURAL

## 2019-02-28 MED ORDER — ACETAMINOPHEN 325 MG PO TABS: 650.0000 mg | ORAL_TABLET | ORAL | Status: DC | PRN

## 2019-02-28 MED ORDER — IBUPROFEN 600 MG PO TABS
600.0000 mg | ORAL_TABLET | Freq: Four times a day (QID) | ORAL | Status: DC
  Administered 2019-02-28 – 2019-03-02 (×7): 600 mg via ORAL
  Filled 2019-02-28 (×7): qty 1

## 2019-02-28 MED ORDER — PRENATAL MULTIVITAMIN CH
1.0000 | ORAL_TABLET | Freq: Every day | ORAL | Status: DC
  Administered 2019-03-01 – 2019-03-02 (×2): 1 via ORAL
  Filled 2019-02-28 (×2): qty 1

## 2019-02-28 MED ORDER — ZOLPIDEM TARTRATE 5 MG PO TABS: 5.0000 mg | ORAL_TABLET | Freq: Every evening | ORAL | Status: DC | PRN

## 2019-02-28 MED ORDER — DIBUCAINE (PERIANAL) 1 % EX OINT: 1.0000 "application " | TOPICAL_OINTMENT | CUTANEOUS | Status: DC | PRN

## 2019-02-28 MED ORDER — SIMETHICONE 80 MG PO CHEW: 80.0000 mg | CHEWABLE_TABLET | ORAL | Status: DC | PRN

## 2019-02-28 MED ORDER — FENTANYL-BUPIVACAINE-NACL 0.5-0.125-0.9 MG/250ML-% EP SOLN
EPIDURAL | Status: AC
  Filled 2019-02-28: qty 250

## 2019-02-28 MED ORDER — COCONUT OIL OIL: 1.0000 "application " | TOPICAL_OIL | Status: DC | PRN

## 2019-02-28 MED ORDER — PHENYLEPHRINE 40 MCG/ML (10ML) SYRINGE FOR IV PUSH (FOR BLOOD PRESSURE SUPPORT): 80.0000 ug | PREFILLED_SYRINGE | INTRAVENOUS | Status: DC | PRN

## 2019-02-28 MED ORDER — PHENYLEPHRINE 40 MCG/ML (10ML) SYRINGE FOR IV PUSH (FOR BLOOD PRESSURE SUPPORT)
PREFILLED_SYRINGE | INTRAVENOUS | Status: AC
  Filled 2019-02-28: qty 10

## 2019-02-28 MED ORDER — DIPHENHYDRAMINE HCL 50 MG/ML IJ SOLN: 12.5000 mg | INTRAMUSCULAR | Status: DC | PRN

## 2019-02-28 MED ORDER — BENZOCAINE-MENTHOL 20-0.5 % EX AERO: 1.0000 "application " | INHALATION_SPRAY | CUTANEOUS | Status: DC | PRN

## 2019-02-28 MED ORDER — SOD CITRATE-CITRIC ACID 500-334 MG/5ML PO SOLN: 30.0000 mL | ORAL | Status: DC | PRN

## 2019-02-28 MED ORDER — FENTANYL CITRATE (PF) 100 MCG/2ML IJ SOLN
INTRAMUSCULAR | Status: AC
  Filled 2019-02-28: qty 2

## 2019-02-28 MED ORDER — ONDANSETRON HCL 4 MG PO TABS: 4.0000 mg | ORAL_TABLET | ORAL | Status: DC | PRN

## 2019-02-28 MED ORDER — LACTATED RINGERS IV SOLN: 500.0000 mL | Freq: Once | INTRAVENOUS | Status: DC

## 2019-02-28 MED ORDER — OXYCODONE-ACETAMINOPHEN 5-325 MG PO TABS: 1.0000 | ORAL_TABLET | ORAL | Status: DC | PRN

## 2019-02-28 NOTE — H&P (Signed)
Lindsay Rojas is a 23 y.o. female, G1P0000, IUP at 40.6 weeks, presenting for early latent labor 40.6. EFW was 6lbs on 11/9, BPP 11/23 8/8. H/O Hyperthyroidism (Thyrotoxicosis), followed by DR Chalmers Cater, last TSH 5/11 was undetectable but normal T4. Pt desires natural delivery and laboring process. Baby Female: [place]. Pt endorse + Fm. Denies vaginal leakage. Denies vaginal bleeding.   There are no problems to display for this patient.  No medications prior to admission.    Past Medical History:  Diagnosis Date  . Medical history non-contributory      No current facility-administered medications on file prior to encounter.   No current outpatient medications on file prior to encounter.     No Known Allergies  History of present pregnancy: Pt Info/Preference:  Screening/Consents:  Labs:   EDD: Estimated Date of Delivery: 02/22/19  Establised: No LMP recorded. Patient is pregnant.  Anatomy Scan: Date: 10/13/2018 Placenta Location: posterior Genetic Screen: Panoroma:declined AFP: Normal First Tri:  Quad: Neg   Office: [facility]             First PNV: 14.4 wg Blood Type  AB+  Language: english Last PNV: 40.5 wg Rhogam  N/A  Flu Vaccine:  declilned   Antibody  Neg  TDaP vaccine declined   GTT: Early: 4.5 hga1c Third Trimester: 102  Feeding Plan: breast BTL: no Rubella:    Contraception: ??? VBAC: mo RPR:     Circumcision: ???   HBsAg:    Pediatrician:  ???   HIV:     Prenatal Classes: no Additional [place]: Yes growth see below GBS:  (For PCN allergy, check sensitivities)       Chlamydia: Neg    MFM Referral/Consult:  GC: neg  Support Person: husband   PAP: Normal  Pain Management: Desires natural Neonatologist Referral:  Hgb Electrophoresis:  AA  Birth Plan: Natueral   Hgb NOB: 12.7    28W: 11  01/26/2019 growth  OB History    Gravida  1   Para      Term      Preterm      AB      Living        SAB      TAB      Ectopic      Multiple      Live Births              Past Medical History:  Diagnosis Date  . Medical history non-contributory    Past Surgical History:  Procedure Laterality Date  . NO PAST SURGERIES     Family History: family history includes Cancer in her mother; Hypertension in her maternal grandmother. Social History:  reports that she has never smoked. She has never used smokeless tobacco. She reports previous alcohol use. She reports previous drug use. Drug: Marijuana.   Prenatal Transfer Tool  Maternal Diabetes: No Genetic Screening: Normal Maternal Ultrasounds/Referrals: Normal Fetal Ultrasounds or other Referrals:  None Maternal Substance Abuse:  No Significant Maternal Medications:  None Significant Maternal Lab Results: Group B Strep negative TSH undetectable at 25 weeks, normal Free T4.   ROS:  Review of Systems  Constitutional: Negative.   HENT: Negative.   Eyes: Negative.   Respiratory: Negative.   Cardiovascular: Negative.   Gastrointestinal: Positive for abdominal pain.  Genitourinary: Negative.   Musculoskeletal: Negative.   Skin: Negative.   Neurological: Negative.   Endo/Heme/Allergies: Negative.   Psychiatric/Behavioral: Negative.      Physical Exam: BP 129/83 (BP Location:  Right Arm)   Pulse 95   Temp 98.5 F (36.9 C) (Oral)   Resp 16   Ht 5\' 3"  (1.6 m)   Wt 67.3 kg   SpO2 98%   BMI 26.29 kg/m   Physical Exam  Constitutional: She is oriented to person, place, and time and well-developed, well-nourished, and in no distress.  HENT:  Head: Normocephalic and atraumatic.  Eyes: Pupils are equal, round, and reactive to light. Conjunctivae are normal.  Cardiovascular: Normal rate and regular rhythm.  Pulmonary/Chest: Effort normal and breath sounds normal.  Abdominal: Soft. Bowel sounds are normal.  Genitourinary:    Genitourinary Comments: Gravida uterus equal to dates, pelvis adequate for vaginal delivery    Musculoskeletal:     Cervical back: Normal range of motion and neck supple.   Neurological: She is alert and oriented to person, place, and time. Gait normal.  Skin: Skin is warm and dry.  Psychiatric: Affect normal.  Nursing note and vitals reviewed.    NST: FHR baseline 150 bpm, Variability: moderate, Accelerations:present, Decelerations:  Absent= Cat 1/Reactive UC:   irregular, every 2-4 minutes, lasting 40-80 seconds SVE:   Dilation: 4.5 Effacement (%): 90 Exam by:: [email or web address], RN, vertex verified by fetal sutures.  Leopold's: Position vertex, EFW 7lbs via leopold's.   Labs: No results found for this or any previous visit (from the past 24 hour(s)).  Imaging:  No results found.  MAU Course: Orders Placed This Encounter  Procedures  . Respiratory Panel by RT PCR (Flu A&B, Covid) - Nasopharyngeal Swab  . Cervical Exam   No orders of the defined types were placed in this encounter.   Assessment/Plan: Lindsay Rojas is a 23 y.o. female, G1P0000, IUP at 40.6 weeks, presenting for early latent labor 40.6. EFW was 6lbs on 11/9, BPP 11/23 8/8. H/O Hyperthyroidism (Thyrotoxicosis), followed by DR 12/23, last TSH 5/11 was undetectable but normal T4. Pt desires natural delivery and laboring process. Baby Female: [place]. Pt endorse + Fm. Denies vaginal leakage. Denies vaginal bleeding.  FWB: Cat 1 Fetal Tracing.   Plan: Admit to Birthing Suite per consult with Dr Picoto Routine [facility] orders Pain med/epidural prn Anticipate labor progression   Sallye Ober NP-C, CNM, MSN 02/28/2019, 4:09 PM

## 2019-02-28 NOTE — MAU Note (Signed)
Lindsay Rojas is a 23 y.o. at [redacted]w[redacted]d here in MAU reporting: contractions since Thursday and states since last night they have gotten more intense. They are coming about every 5 min. Saw some bloody show. No LOF. +FM. Was 3 cm yesterday.  Onset of complaint: ongoing but getting worse  Pain score: 7/10  Vitals:   02/28/19 1324  BP: 129/83  Pulse: 95  Resp: 16  Temp: 98.5 F (36.9 C)  SpO2: 98%     FHT: +FM  Lab orders placed from triage: none

## 2019-02-28 NOTE — Anesthesia Preprocedure Evaluation (Signed)
Anesthesia Evaluation  Patient identified by MRN, date of birth, ID band Patient awake    Reviewed: Allergy & Precautions, H&P , Patient's Chart, lab work & pertinent test results  Airway Mallampati: II  TM Distance: >3 FB Neck ROM: full    Dental no notable dental hx. (+) Teeth Intact   Pulmonary neg pulmonary ROS,    Pulmonary exam normal breath sounds clear to auscultation       Cardiovascular negative cardio ROS Normal cardiovascular exam Rhythm:regular Rate:Normal     Neuro/Psych negative neurological ROS  negative psych ROS   GI/Hepatic negative GI ROS, Neg liver ROS,   Endo/Other  negative endocrine ROS  Renal/GU negative Renal ROS  negative genitourinary   Musculoskeletal   Abdominal   Peds  Hematology negative hematology ROS (+)   Anesthesia Other Findings   Reproductive/Obstetrics (+) Pregnancy                             Anesthesia Physical Anesthesia Plan  ASA: II  Anesthesia Plan: Epidural   Post-op Pain Management:    Induction:   PONV Risk Score and Plan:   Airway Management Planned:   Additional Equipment:   Intra-op Plan:   Post-operative Plan:   Informed Consent: I have reviewed the patients History and Physical, chart, labs and discussed the procedure including the risks, benefits and alternatives for the proposed anesthesia with the patient or authorized representative who has indicated his/her understanding and acceptance.     Plan Discussed with: Anesthesiologist  Anesthesia Plan Comments:         Anesthesia Quick Evaluation  

## 2019-02-28 NOTE — MAU Provider Note (Signed)
Ms. British Moyd is a G1P0 at [redacted]w[redacted]d seen in MAU for labor. RN labor check initially, pt with variable decelerations so CNM in to see pt.    SVE by RN Dilation: 4.5 Effacement (%): 90 Cervical Position: Middle Exam by:: Marlan Palau, RN   NST - FHR: 135 bpm / moderate variability / accels present / variable decels / TOCO: regular every 5-7 mins   Vertex confirmed with bedside US  Dilation: 4.5 Effacement (%): 90 Cervical Position: Middle Exam by:: Marlan Palau, RN  Pt informed that the ultrasound is considered a limited OB ultrasound and is not intended to be a complete ultrasound exam.  Patient also informed that the ultrasound is not being completed with the intent of assessing for fetal or placental anomalies or any pelvic abnormalities.  Explained that the purpose of today's ultrasound is to assess for  presentation.  Patient acknowledges the purpose of the exam and the limitations of the study.     Plan:  Recommend admission for early labor [place], who will place admission orders and admit to L&D   Fatima Blank, CNM  02/28/2019 3:37 PM

## 2019-02-28 NOTE — Progress Notes (Signed)
Labor Progress Note  Lindsay Rojas is a 23 y.o. female, G1P0000, IUP at 40.6 weeks, presenting for early latent labor 40.6. EFW was 6lbs on 11/9, BPP 11/23 8/8. H/O Hyperthyroidism (Thyrotoxicosis), followed by DR Chalmers Cater, last TSH 5/11 was undetectable but normal T4. Pt desires natural delivery and laboring process. Baby Female: Lindsay Rojas, out pt circ desired.   Subjective: Pt resting well, talked with pt about plan of care and course she would like to take, reviewed R/B/A of natural labor versus, AROM, and epidural. Pt requested and verbalized consented to  epidural and AROM. All questions answered. Pt currently comfortable with epidural place, and restingin bed, tolerated procedure very well.Pt endorses wanting to push on hands and knees when the time comes, pt ensured if she has feeling and good mobility in her legs that she may push any which way she desires.  Patient Active Problem List   Diagnosis Date Noted  . Normal labor and delivery 02/28/2019   Objective: BP 120/75   Pulse 84   Temp 98.5 F (36.9 C) (Oral)   Resp 16   Ht 5\' 3"  (1.6 m)   Wt 67.1 kg   SpO2 98%   BMI 26.22 kg/m  No intake/output data recorded. No intake/output data recorded. NST: FHR baseline 140 bpm, Variability: moderate, Accelerations:present, Decelerations:  Absent= Cat 1/Reactive CTX:  irregular, every 2-4 minutes, lasting 60 seconds Uterus gravid, soft non tender, moderate to palpate with contractions.  SVE:  Dilation: 5.5 Effacement (%): 100 Station: 0 Exam by:: J Mariame Rybolt CNM  AROM, moderate, clear, tolerated well.   Assessment:  Lindsay Rojas is a 23 y.o. female, G1P0000, IUP at 40.6 weeks, presenting for early latent labor 40.6. EFW was 6lbs on 11/9, BPP 11/23 8/8. H/O Hyperthyroidism (Thyrotoxicosis), followed by DR Chalmers Cater, last TSH 5/11 was undetectable but normal T4. Pt desires natural delivery and laboring process. Baby Female: Lindsay Rojas. Progressing in latent-active labor now. cxt slowed after  epidural placement, AROM clear, tolerated well.  Patient Active Problem List   Diagnosis Date Noted  . Normal labor and delivery 02/28/2019   NICHD: Category 1  Membranes:  AROM, @ 3419 on 12/12 clear x 0hrs, no s/s of infection  Pain management:               IV pain management: x0             Epidural placement:  at 1730 on 12/12  GBS Negative  Plan: Continue labor plan Continuous monitoring Rest Frequent position changes to facilitate fetal rotation and descent. Will reassess with cervical exam at  2100 or earlier if necessary Anticipate pitocin if no cervical change with slowed cxt at 2100 pitocin per protocol Anticipate labor progression and vaginal delivery.    Lindsay Rojas updated.   Noralyn Pick, NP-C, CNM, MSN 02/28/2019. 6:28 PM

## 2019-02-28 NOTE — Anesthesia Procedure Notes (Signed)
Epidural Patient location during procedure: OB Start time: 02/28/2019 5:34 PM End time: 02/28/2019 5:42 PM  Staffing Anesthesiologist: Josephine Igo, MD Performed: anesthesiologist   Preanesthetic Checklist Completed: patient identified, IV checked, site marked, risks and benefits discussed, surgical consent, monitors and equipment checked, pre-op evaluation and timeout performed  Epidural Patient position: sitting Prep: DuraPrep and site prepped and draped Patient monitoring: continuous pulse ox and blood pressure Approach: midline Location: L3-L4 Injection technique: LOR air  Needle:  Needle type: Tuohy  Needle gauge: 17 G Needle length: 9 cm and 9 Needle insertion depth: 5 cm cm Catheter type: closed end flexible Catheter size: 19 Gauge Catheter at skin depth: 10 cm Test dose: negative and Other  Assessment Events: blood not aspirated, injection not painful, no injection resistance, no paresthesia and negative IV test  Additional Notes Patient identified. Risks and benefits discussed including failed block, incomplete  Pain control, post dural puncture headache, nerve damage, paralysis, blood pressure Changes, nausea, vomiting, reactions to medications-both toxic and allergic and post Partum back pain. All questions were answered. Patient expressed understanding and wished to proceed. Sterile technique was used throughout procedure. Epidural site was Dressed with sterile barrier dressing. No paresthesias, signs of intravascular injection Or signs of intrathecal spread were encountered.  Patient was more comfortable after the epidural was dosed. Please see RN's note for documentation of vital signs and FHR which are stable. Reason for block:procedure for pain

## 2019-03-01 LAB — CBC
HCT: 32.1 % — ABNORMAL LOW (ref 36.0–46.0)
Hemoglobin: 10.3 g/dL — ABNORMAL LOW (ref 12.0–15.0)
MCH: 27.2 pg (ref 26.0–34.0)
MCHC: 32.1 g/dL (ref 30.0–36.0)
MCV: 84.7 fL (ref 80.0–100.0)
Platelets: 185 10*3/uL (ref 150–400)
RBC: 3.79 MIL/uL — ABNORMAL LOW (ref 3.87–5.11)
RDW: 13.7 % (ref 11.5–15.5)
WBC: 17.5 10*3/uL — ABNORMAL HIGH (ref 4.0–10.5)
nRBC: 0 % (ref 0.0–0.2)

## 2019-03-01 LAB — RPR: RPR Ser Ql: NONREACTIVE

## 2019-03-01 MED ORDER — IBUPROFEN 600 MG PO TABS: 600.0000 mg | ORAL_TABLET | Freq: Four times a day (QID) | ORAL | 0 refills | Status: AC

## 2019-03-01 NOTE — Anesthesia Postprocedure Evaluation (Signed)
Anesthesia Post Note  Patient: Lindsay Rojas  Procedure(s) Performed: AN AD [place]     Patient location during evaluation: Mother Baby Anesthesia Type: Epidural Level of consciousness: awake and alert, oriented and patient cooperative Pain management: pain level controlled Vital Signs Assessment: post-procedure vital signs reviewed and stable Respiratory status: spontaneous breathing Cardiovascular status: stable Postop Assessment: no headache, epidural receding, patient able to bend at knees and no signs of nausea or vomiting Anesthetic complications: no Comments: Pain score 4. Pt. States pain is manageable.     Last Vitals:  Vitals:   02/28/19 2332 03/01/19 0501  BP: 134/89 119/79  Pulse: 81 85  Resp: 16 16  Temp: 36.9 C 36.9 C  SpO2: 99% 98%    Last Pain:  Vitals:   03/01/19 0501  TempSrc: Oral  PainSc: 2    Pain Goal:                   [facility]

## 2019-03-01 NOTE — Lactation Note (Signed)
This note was copied from a baby's chart. Lactation Consultation Note  Patient Name: Lindsay Rojas [id number] Date: 03/01/2019 Reason for consult: Initial assessment;1st time breastfeeding;Term;Maternal endocrine disorder Type of Endocrine Disorder?: Thyroid P1, 5 hour female infant. Mom with hx of hyperthyroidism. Infant had one void and one stools since birth. Per mom, she receives [facility] in [facility] and her mom has ordered her a DEBP. Per mom, this will be infant's 4th time latching at breast. Per mom, she wants LC to assist with latch she has been experiencing pinching when infant is latched. Mom latched infant on right breast using the football hold, [place] asked mom to tickle infant below nose with her breast, wait until infant's mouth is wide, bringing infant chin first. LC notice infant had deep latch, swallows observed and per mom, she is not feeling any pinching with this latch. Infant was still breastfeeding after 6 minutes when LC left the room. Mom understands to breastfeed infant according to hunger cues, 8 to 12 times within 24 hours and on demand. Mom knows to call RN or LC if she has questions, concerns or needs assistance with latching infant to breast. Parents will continue to do as much STS as possible. Mom made aware of O/P services, breastfeeding support groups, community resources, and our phone # for post-discharge questions.   Maternal Data Formula Feeding for Exclusion: No Does the patient have breastfeeding experience prior to this delivery?: No  Feeding Feeding Type: Breast Fed  LATCH Score Latch: Grasps breast easily, tongue down, lips flanged, rhythmical sucking.  Audible Swallowing: Spontaneous and intermittent  Type of Nipple: Everted at rest and after stimulation  Comfort (Breast/Nipple): Soft / non-tender  Hold (Positioning): Assistance needed to correctly position infant at breast and maintain latch.  LATCH Score:  9  Interventions Interventions: Breast feeding basics reviewed;Breast compression;Assisted with latch;Adjust position;Skin to skin;Support pillows;Breast massage;Position options;Expressed milk;Hand express  Lactation Tools Discussed/Used WIC Program: Yes(Mom has DEBP coming that her mother ordered for her.)   Consult Status Consult Status: Follow-up Date: 03/01/19 Follow-up type: In-patient    Lindsay Rojas 03/01/2019, 1:45 AM

## 2019-03-01 NOTE — Discharge Summary (Addendum)
SVD OB Discharge Summary     Patient Name: Lindsay Rojas DOB: 07-17-1995 MRN: [id number]  Date of admission: 02/28/2019 Delivering MD: Noralyn Pick  Date of delivery: 02/28/2019 Type of delivery: SVD  Newborn Data: Sex: Baby female Name: Lindsay Rojas Circumcision: out pt by [facility], parents to request Vit K injection prior to discharge from hospital. Live born female  Birth Weight: 6 lb 14.4 oz (3130 g) APGAR: 8, 9  Newborn Delivery   Birth date/time: 02/28/2019 20:18:00 Delivery type: Vaginal, Spontaneous      Feeding: breast Infant being discharge to home with mother in stable condition.   Admitting diagnosis: Normal labor and delivery [O80] Intrauterine pregnancy: [redacted]w[redacted]d     Secondary diagnosis:  Active Problems:   Normal labor and delivery   Normal postpartum course                                Complications: None                                                              Intrapartum Procedures: spontaneous vaginal delivery Postpartum Procedures: none Complications-Operative and Postpartum: none Augmentation: AROM   History of Present Illness: Ms. Lindsay Rojas is a 23 y.o. female, G1P1001, who presents at [redacted]w[redacted]d weeks gestation. The patient has been followed at  [facility]  Her pregnancy has been complicated by:  Patient Active Problem List   Diagnosis Date Noted  . Normal labor and delivery 02/28/2019  . Normal postpartum course 02/28/2019    Hospital course:  Onset of Labor With Vaginal Delivery     23 y.o. yo G1P1001 at [redacted]w[redacted]d was admitted in Latent Labor on 02/28/2019. Patient had an uncomplicated labor course as follows:  Membrane Rupture Time/Date: 6:00 PM ,02/28/2019   Intrapartum Procedures: Episiotomy: None [1]                                         Lacerations:  None [1]  Patient had a delivery of a Viable infant. 02/28/2019  Information for the patient's newborn:  Lindsay, Rojas [[id number]]  Delivery  Method: Vaginal, Spontaneous(Filed from Delivery Summary)     Pateint had an uncomplicated postpartum course.  She is ambulating, tolerating a regular diet, passing flatus, and urinating well. Patient is discharged home in stable condition on 03/01/19.  Postpartum Day # 1 : S/P NSVD due to spontaneous labor and delivery. Patient up ad lib, denies syncope or dizziness. Reports consuming regular diet without issues and denies N/V. Patient reports 0 bowel movement + passing flatus.  Denies issues with urination and reports bleeding is "light."  Patient is breastfeeding and reports going well.  Desires condums for postpartum contraception.  Pain is being appropriately managed with use of po meds.     Physical exam  Vitals:   02/28/19 2146 02/28/19 2220 02/28/19 2332 03/01/19 0501  BP: 111/71 (!) 131/91 134/89 119/79  Pulse: 70 90 81 85  Resp: 16 16 16 16   Temp:  98.4 F (36.9 C) 98.5 F (36.9 C) 98.4 F (36.9 C)  TempSrc:  Oral Oral Oral  SpO2:  99% 99% 98%  Weight:      Height:       General: alert, cooperative and no distress Lochia: appropriate Uterine Fundus: firm Perineum: Intact DVT Evaluation: No evidence of DVT seen on physical exam. Negative Homan's sign. No cords or calf tenderness. No significant calf/ankle edema.  Labs: Lab Results  Component Value Date   WBC 14.9 (H) 02/28/2019   HGB 11.6 (L) 02/28/2019   HCT 35.7 (L) 02/28/2019   MCV 83.2 02/28/2019   PLT 208 02/28/2019   No flowsheet data found.  Date of discharge: 03/01/2019 Discharge Diagnoses: Term Pregnancy-delivered Discharge instruction: per After Visit Summary and "Baby and Me Booklet".  After visit meds:   Activity:           unrestricted and pelvic rest Advance as tolerated. Pelvic rest for 6 weeks.  Diet:                routine Medications: PNV and Ibuprofen Postpartum contraception: Condoms Condition:  Pt discharge to home with baby in stable condition after PKU for newborn tonight.   Hyperthyroidism: F/U with Dr Talmage Nap.   Meds: Allergies as of 03/01/2019   No Known Allergies     Medication List    TAKE these medications   ibuprofen 600 MG tablet Commonly known as: ADVIL Take 1 tablet (600 mg total) by mouth every 6 (six) hours.       Discharge Follow Up:  Follow-up Information    [facility]. Schedule an appointment as soon as possible for a visit in 6 week(s).   Specialty: Obstetrics and Gynecology Contact information: [address] [place] [address] [phone]           [place], NP-C, CNM 03/01/2019, 6:26 AM  Dale [place], FNP   Addendum Original discharge delayed because infant needed to remain in hospital for additional 24 hours. Infant due to be discharged today, 03/02/19.   Rhea Pink, MSN, CNM 03/02/2019 6:36 AM

## 2019-03-01 NOTE — Lactation Note (Signed)
This note was copied from a baby's chart. Lactation Consultation Note  Patient Name: Lindsay Rojas ZYSAY'T Date: 03/01/2019 Reason for consult: Follow-up assessment   P1 mom states she is struggling to get infant latched.  To right side.  LC offered assistance since baby was stirring in bassinet.  Dad encouraged mom to bf now with help in room.  Mom agreed.  She states she hasn't seen colostrum from right side but LC worked with her on hand exp. And mom was then able to see several drops of EBM.  Mom and dad were both excited.  Infant placed STS and rooted around then latched in football hold.  Mom used massage and compression and was able to keep infant actively sucking.  Mom denied any pain during feed.  Dad helped use pillows and blankets to support mom.  Infant arms relaxed and sucked ceased after 20 minutes.  [place] reviewed with mom how to detach infant at breast.  Mom broke latch and infant was burped.  Mom and dad stated they felt better having infant bf on the right.    LC reviewed bf basics.  Cluster feeding, feeding cues, and waking techniques reviewed with family.    Support group information shared with family and Lactation phone line and OP services reviewed.        Maternal Data Has patient been taught Hand Expression?: Yes Does the patient have breastfeeding experience prior to this delivery?: No  Feeding Feeding Type: Breast Fed  LATCH Score Latch: Grasps breast easily, tongue down, lips flanged, rhythmical sucking.  Audible Swallowing: A few with stimulation  Type of Nipple: Everted at rest and after stimulation  Comfort (Breast/Nipple): Soft / non-tender  Hold (Positioning): Assistance needed to correctly position infant at breast and maintain latch.  LATCH Score: 8  Interventions Interventions: Breast feeding basics reviewed;Assisted with latch;Skin to skin;Breast massage;Hand express;Position options;Support pillows;Adjust position;Breast  compression  Lactation Tools Discussed/Used     Consult Status Consult Status: Follow-up Date: 03/02/19 Follow-up type: In-patient    Ferne Coe [facility] 03/01/2019, 9:11 PM

## 2019-04-20 ENCOUNTER — Encounter: Payer: Self-pay | Admitting: Emergency Medicine

## 2019-04-20 ENCOUNTER — Other Ambulatory Visit: Payer: Self-pay

## 2019-04-20 DIAGNOSIS — O200 Threatened abortion: Secondary | ICD-10-CM | POA: Insufficient documentation

## 2019-04-20 DIAGNOSIS — E876 Hypokalemia: Secondary | ICD-10-CM | POA: Insufficient documentation

## 2019-04-20 DIAGNOSIS — Z3A00 Weeks of gestation of pregnancy not specified: Secondary | ICD-10-CM | POA: Insufficient documentation

## 2019-04-20 DIAGNOSIS — O219 Vomiting of pregnancy, unspecified: Secondary | ICD-10-CM | POA: Insufficient documentation

## 2019-04-20 DIAGNOSIS — Z87891 Personal history of nicotine dependence: Secondary | ICD-10-CM | POA: Insufficient documentation

## 2019-04-20 DIAGNOSIS — Z79899 Other long term (current) drug therapy: Secondary | ICD-10-CM | POA: Insufficient documentation

## 2019-04-20 LAB — URINALYSIS, COMPLETE (UACMP) WITH MICROSCOPIC
Bacteria, UA: NONE SEEN
Bilirubin Urine: NEGATIVE
Glucose, UA: NEGATIVE mg/dL
Ketones, ur: 5 mg/dL — AB
Nitrite: NEGATIVE
Protein, ur: 30 mg/dL — AB
Specific Gravity, Urine: 1.033 — ABNORMAL HIGH (ref 1.005–1.030)
pH: 6 (ref 5.0–8.0)

## 2019-04-20 LAB — CBC WITH DIFFERENTIAL/PLATELET
Abs Immature Granulocytes: 0.02 10*3/uL (ref 0.00–0.07)
Basophils Absolute: 0.1 10*3/uL (ref 0.0–0.1)
Basophils Relative: 1 %
Eosinophils Absolute: 0 10*3/uL (ref 0.0–0.5)
Eosinophils Relative: 0 %
HCT: 40.4 % (ref 36.0–46.0)
Hemoglobin: 13.5 g/dL (ref 12.0–15.0)
Immature Granulocytes: 0 %
Lymphocytes Relative: 24 %
Lymphs Abs: 1.8 10*3/uL (ref 0.7–4.0)
MCH: 28.5 pg (ref 26.0–34.0)
MCHC: 33.4 g/dL (ref 30.0–36.0)
MCV: 85.4 fL (ref 80.0–100.0)
Monocytes Absolute: 0.7 10*3/uL (ref 0.1–1.0)
Monocytes Relative: 9 %
Neutro Abs: 4.8 10*3/uL (ref 1.7–7.7)
Neutrophils Relative %: 66 %
Platelets: 364 10*3/uL (ref 150–400)
RBC: 4.73 MIL/uL (ref 3.87–5.11)
RDW: 13.4 % (ref 11.5–15.5)
WBC: 7.3 10*3/uL (ref 4.0–10.5)
nRBC: 0 % (ref 0.0–0.2)

## 2019-04-20 NOTE — ED Triage Notes (Addendum)
Patient ambulatory to triage with steady gait, without difficulty or distress noted; pt reports +home pregnancy test, now with vag bleeding and lower abd cramping; G4P2; pt is B+ in results

## 2019-04-20 NOTE — ED Notes (Signed)
POC urine pregnancy resulted= POSITIVE

## 2019-04-21 ENCOUNTER — Encounter: Payer: Self-pay | Admitting: Radiology

## 2019-04-21 ENCOUNTER — Emergency Department
Admission: EM | Admit: 2019-04-21 | Discharge: 2019-04-21 | Disposition: A | Discharge status: Home / Self Care | Payer: Self-pay | Attending: Emergency Medicine | Admitting: Emergency Medicine

## 2019-04-21 ENCOUNTER — Emergency Department: Payer: Self-pay

## 2019-04-21 DIAGNOSIS — R112 Nausea with vomiting, unspecified: Secondary | ICD-10-CM

## 2019-04-21 DIAGNOSIS — O200 Threatened abortion: Secondary | ICD-10-CM

## 2019-04-21 DIAGNOSIS — E876 Hypokalemia: Secondary | ICD-10-CM

## 2019-04-21 DIAGNOSIS — O4690 Antepartum hemorrhage, unspecified, unspecified trimester: Secondary | ICD-10-CM

## 2019-04-21 DIAGNOSIS — O3680X0 Pregnancy with inconclusive fetal viability, not applicable or unspecified: Secondary | ICD-10-CM

## 2019-04-21 LAB — COMPREHENSIVE METABOLIC PANEL
ALT: 18 U/L (ref 0–44)
AST: 25 U/L (ref 15–41)
Albumin: 4.3 g/dL (ref 3.5–5.0)
Alkaline Phosphatase: 50 U/L (ref 38–126)
Anion gap: 9 (ref 5–15)
BUN: 12 mg/dL (ref 6–20)
CO2: 29 mmol/L (ref 22–32)
Calcium: 9.3 mg/dL (ref 8.9–10.3)
Chloride: 99 mmol/L (ref 98–111)
Creatinine, Ser: 1.04 mg/dL — ABNORMAL HIGH (ref 0.44–1.00)
GFR calc Af Amer: >60 mL/min (ref >60)
GFR calc non Af Amer: >60 mL/min (ref >60)
Glucose, Bld: 122 mg/dL — ABNORMAL HIGH (ref 70–99)
Potassium: 2.7 mmol/L — CL (ref 3.5–5.1)
Sodium: 137 mmol/L (ref 135–145)
Total Bilirubin: 1.2 mg/dL (ref 0.3–1.2)
Total Protein: 8.5 g/dL — ABNORMAL HIGH (ref 6.5–8.1)

## 2019-04-21 LAB — HCG, QUANTITATIVE, PREGNANCY: hCG, Beta Chain, Quant, S: 10 m[IU]/mL — ABNORMAL HIGH (ref <5)

## 2019-04-21 LAB — POCT PREGNANCY, URINE: Preg Test, Ur: POSITIVE — AB

## 2019-04-21 MED ORDER — POTASSIUM CHLORIDE CRYS ER 20 MEQ PO TBCR: 20.0000 meq | EXTENDED_RELEASE_TABLET | Freq: Every day | ORAL | 0 refills | Status: DC

## 2019-04-21 MED ORDER — ONDANSETRON 4 MG PO TBDP
4.0000 mg | ORAL_TABLET | Freq: Once | ORAL | Status: AC
  Administered 2019-04-21: 4 mg via ORAL
  Filled 2019-04-21: qty 1

## 2019-04-21 MED ORDER — ONDANSETRON 4 MG PO TBDP: ORAL_TABLET | ORAL | 0 refills | Status: DC

## 2019-04-21 MED ORDER — POTASSIUM CHLORIDE CRYS ER 20 MEQ PO TBCR
40.0000 meq | EXTENDED_RELEASE_TABLET | Freq: Once | ORAL | Status: AC
  Administered 2019-04-21: 02:00:00 40 meq via ORAL
  Filled 2019-04-21: qty 2

## 2019-04-21 NOTE — Discharge Instructions (Addendum)
You were seen tonight for vaginal bleeding (along with nausea and vomiting) and a positive pregnancy test.  Although your pregnancy test was positive in the emergency department as well, the number is very low, and it is not possible to tell at this time whether you are having a miscarriage or whether it is just too early in pregnancy to know for sure.  However the fact that you are having vaginal bleeding similar to a normal menstrual period suggest that you may be having a miscarriage and very early pregnancy.  I recommend that you follow-up with an OB/GYN provider such as Dr. [facility] at [facility] or at [facility], where you have been seen before.  You should try to follow-up by the end of this week for some repeat blood work including a repeat hCG level.  This will help determine if the numbers going up like it should in a normal pregnancy, or if it is going down or staying the same which would suggest a miscarriage.  It is also possible the pregnancy is developing in a location other than your uterus, called an ectopic pregnancy, but again, you need to have repeat blood work and a repeat ultrasound to know for sure.    Return to the emergency department if you develop new or worsening symptoms that concern you.

## 2019-04-21 NOTE — ED Notes (Signed)
Dr York Cerise made aware that pt has an ABO/Rh blood test result available in Epic from 10/02/15; per Dr York Cerise, repeat testing will not be needed and order can be d/c'd.

## 2019-04-21 NOTE — ED Notes (Signed)
Report off to kate rn  

## 2019-04-21 NOTE — ED Notes (Signed)
Pt reports vag bleeding and cramping.  Positive home pregnancy test.  Pt denies urinary sx .  No back pain.  G4p2a1   Pt alert.

## 2019-04-21 NOTE — ED Provider Notes (Signed)
[facility] Emergency Department Provider Note  ____________________________________________   First MD Initiated Contact with Patient 04/21/19 [phone]     (approximate)  I have reviewed the triage vital signs and the nursing notes.   HISTORY  Chief Complaint Vaginal Bleeding    HPI Kaitlyn Richmond is a 24 y.o. female G4 P2 Ab 1 who presents for evaluation of persistent nausea and vomiting for several days and acute onset vaginal bleeding tonight that is consistent with her usual period but with four positive home pregnancy tests.   She reports that her last period was about 30 days ago so she is technically 2 days late.  She has been having some abdominal cramping that can be severe at times but is not currently.  She denies fever/chills, sore throat, chest pain, cough.  Nothing particular makes the nausea and vomiting better and she has not been eating very much and said that she feels dehydrated.  She previously went to [facility] but that was about 4 years ago and [facility].        Past Medical History:  Diagnosis Date  . Hx: UTI (urinary tract infection)     Patient Active Problem List   Diagnosis Date Noted  . Overweight 05/09/2018  . Indication for care in labor or delivery 10/02/2015    History reviewed. No pertinent surgical history.  Prior to Admission medications   Medication Sig Start Date End Date Taking? Authorizing Provider  clindamycin (CLEOCIN) 300 MG capsule Take 1 capsule (300 mg total) 3 (three) times daily by mouth. 01/25/17   Irean Hong, MD  ibuprofen (ADVIL,MOTRIN) 600 MG tablet Take 1 tablet (600 mg total) by mouth every 6 (six) hours. 10/04/15   Marta Antu, CNM  magic mouthwash SOLN Take 5 mLs 3 (three) times daily as needed by mouth for mouth pain. 01/25/17   Irean Hong, MD  medroxyPROGESTERone (DEPO-PROVERA) 150 MG/ML injection Inject 1 mL (150 mg  total) into the muscle every 3 (three) months. 10/04/15   Marta Antu, CNM  ondansetron (ZOFRAN ODT) 4 MG disintegrating tablet Allow 1-2 tablets to dissolve in your mouth every 8 hours as needed for nausea/vomiting 04/21/19   Loleta Rose, MD  oxyCODONE-acetaminophen (PERCOCET/ROXICET) 5-325 MG tablet Take 1 tablet by mouth every 4 (four) hours as needed (pain scale 4-7). 10/04/15   Brothers, Delaney Meigs, CNM  potassium chloride SA (KLOR-CON M20) 20 MEQ tablet Take 1 tablet (20 mEq total) by mouth daily. 04/21/19   Loleta Rose, MD    Allergies Penicillins and Shellfish allergy  Family History  Problem Relation Age of Onset  . Leukemia Maternal Uncle   . Diabetes Maternal Grandmother   . Hypertension Paternal Grandmother     Social History Social History   Tobacco Use  . Smoking status: Former Games developer  . Smokeless tobacco: Never Used  Substance Use Topics  . Alcohol use: Yes  . Drug use: Not on file    Review of Systems Constitutional: No fever/chills Eyes: No visual changes. ENT: No sore throat. Cardiovascular: Denies chest pain. Respiratory: Denies shortness of breath. Gastrointestinal: Lower abdominal/pelvic cramping.  Several days of nausea and vomiting.  No diarrhea.  No constipation. Genitourinary: Vaginal bleeding consistent with a normal menstrual period. Musculoskeletal: Negative for neck pain.  Negative for back pain. Integumentary: Negative for rash. Neurological: Negative for headaches, focal weakness or numbness.   ____________________________________________   PHYSICAL EXAM:  VITAL  SIGNS: ED Triage Vitals [04/20/19 2329]  Enc Vitals Group     BP (!) 131/91     Pulse Rate 98     Resp 16     Temp 98.5 F (36.9 C)     Temp Source Oral     SpO2 100 %     Weight 63.5 kg (140 lb)     Height 1.575 m (5\' 2" )     Head Circumference      Peak Flow      Pain Score 4     Pain Loc      Pain Edu?      Excl. in [place]?     Constitutional: Alert and oriented.   No apparent distress at this time, texting on her phone without difficulty and communicating well with me. Eyes: Conjunctivae are normal.  Head: Atraumatic. Nose: No congestion/rhinnorhea. Mouth/Throat: Patient is wearing a mask. Neck: No stridor.  No meningeal signs.   Cardiovascular: Normal rate, regular rhythm. Good peripheral circulation. Grossly normal heart sounds. Respiratory: Normal respiratory effort.  No retractions. Gastrointestinal: Soft and nontender. No distention.  Genitourinary: Deferred at patient preference. Musculoskeletal: No lower extremity tenderness nor edema. No gross deformities of extremities. Neurologic:  Normal speech and language. No gross focal neurologic deficits are appreciated.  Skin:  Skin is warm, dry and intact. Psychiatric: Mood and affect are normal. Speech and behavior are normal.  ____________________________________________   LABS (all labs ordered are listed, but only abnormal results are displayed)  Labs Reviewed  COMPREHENSIVE METABOLIC PANEL - Abnormal; Notable for the following components:      Result Value   Potassium 2.7 (*)    Glucose, Bld 122 (*)    Creatinine, Ser 1.04 (*)    Total Protein 8.5 (*)    All other components within normal limits  HCG, QUANTITATIVE, PREGNANCY - Abnormal; Notable for the following components:   hCG, Beta Chain, Quant, S 10 (*)    All other components within normal limits  URINALYSIS, COMPLETE (UACMP) WITH MICROSCOPIC - Abnormal; Notable for the following components:   Color, Urine YELLOW (*)    APPearance CLEAR (*)    Specific Gravity, Urine 1.033 (*)    Hgb urine dipstick SMALL (*)    Ketones, ur 5 (*)    Protein, ur 30 (*)    Leukocytes,Ua TRACE (*)    All other components within normal limits  CBC WITH DIFFERENTIAL/PLATELET   ____________________________________________  EKG  None - EKG not ordered by ED physician ____________________________________________  RADIOLOGY ,  personally viewed and evaluated these images (plain radiographs) as part of my medical decision making, as well as reviewing the written report by the radiologist.  ED MD interpretation:  No specific findings including no yolk sac  Official radiology report(s): Marylou Mccoy OB LESS THAN 14 WEEKS WITH OB TRANSVAGINAL  Result Date: 04/21/2019 CLINICAL DATA:  Vaginal bleeding x1 day EXAM: OBSTETRIC <14 WK 06/19/2019 AND TRANSVAGINAL OB US TECHNIQUE: Both transabdominal and transvaginal ultrasound examinations were performed for complete evaluation of the gestation as well as the maternal uterus, adnexal regions, and pelvic cul-de-sac. Transvaginal technique was performed to assess early pregnancy. COMPARISON:  None. FINDINGS: Intrauterine gestational sac: None Yolk sac:  Not Visualized. Embryo:  Visualized. Maternal uterus/adnexae: In the right adnexal region there is a 1.1 x 1.1 x 0.7 cm cyst that appears to be separate from the right ovary. The left ovary is unremarkable. The endometrium measures approximately 8 mm in thickness. IMPRESSION: 1.  No  IUP is visualized. By definition, in the setting of a positive pregnancy test, this reflects a pregnancy of unknown location. Differential considerations include early normal IUP, abnormal IUP/missed abortion, or nonvisualized ectopic pregnancy. Serial beta HCG is suggested. Consider repeat pelvic ultrasound in 14 days. 2. Probable 1.1 cm paraovarian cyst. Electronically Signed   By: Katherine Mantle M.D.   On: 04/21/2019 03:41    ____________________________________________   PROCEDURES   Procedure(s) performed (including Critical Care):  Procedures   ____________________________________________   INITIAL IMPRESSION / MDM / ASSESSMENT AND PLAN / ED COURSE  As part of my medical decision making, I reviewed the following data within the electronic MEDICAL RECORD NUMBER Nursing notes reviewed and incorporated, Labs reviewed , Old chart reviewed and Notes from prior ED  visits   Differential diagnosis includes, but is not limited to, threatened miscarriage, incomplete miscarriage, normal bleeding from an early trimester pregnancy, ectopic pregnancy, , blighted ovum, vaginal/cervical trauma, subchorionic hemorrhage/hematoma, etc.  Based on the patient's hCG of 10, I think it is likely that she is having a miscarriage, although it is also possible that she is simply very early in her pregnancy.  However the fact that she is having menstrual flow consistent with a normal period suggests to me that this is a miscarriage.  She is in no distress at this time except for some persistent nausea.  She tell me she feels dehydrated and even though her lab work is reassuring I offered to have an IV placed to give her some fluids but she declines.  She also declines a pelvic exam which I think is understandable and appropriate under the circumstances.  I encouraged her to allow [place] to get an ultrasound to which she agreed.  I told her to anticipate that likely the ultrasound would not be definitive and that she will need to follow-up with OB/GYN in about 2 days, no more than 2 weeks, for repeat hCG and/or ultrasound.  She says that she understands.      Clinical Course as of Apr 20 348  Tue Apr 21, 2019  0307 Prior T&S reports blood type as B+, no indication for [facility]   [CF]  9678 Patient tolerating PO intake and tolerated the potassium supplement(s)   [CF]  0345 No specific findings on the ultrasound, the patient has a paraovarian cyst that is relatively small and no intrauterine pregnancy.  I reiterated my recommendation for outpatient follow-up with the patient and after explaining the ultrasound findings and encouraged her to schedule an appointment with OB/GYN at the next available opportunity.  I gave my usual and customary return precautions.  US OB LESS THAN 14 WEEKS WITH OB TRANSVAGINAL [CF]    Clinical Course User Index [CF] Loleta Rose, MD      ____________________________________________  FINAL CLINICAL IMPRESSION(S) / ED DIAGNOSES  Final diagnoses:  Vaginal bleeding in pregnancy  Non-intractable vomiting with nausea, unspecified vomiting type  Hypokalemia due to excessive gastrointestinal loss of potassium  Threatened miscarriage  Pregnancy of unknown anatomic location     MEDICATIONS GIVEN DURING THIS VISIT:  Medications  ondansetron (ZOFRAN-ODT) disintegrating tablet 4 mg (4 mg Oral Given 04/21/19 0215)  potassium chloride SA (KLOR-CON) CR tablet 40 mEq (40 mEq Oral Given 04/21/19 0215)     ED Discharge Orders         Ordered    ondansetron (ZOFRAN ODT) 4 MG disintegrating tablet     04/21/19 0208    potassium chloride SA (KLOR-CON M20) 20 MEQ tablet  Daily     04/21/19 0348          *Please note:  Kaitlyn Richmond was evaluated in Emergency Department on 04/21/2019 for the symptoms described in the history of present illness. She was evaluated in the context of the global COVID-19 pandemic, which necessitated consideration that the patient might be at risk for infection with the SARS-CoV-2 virus that causes COVID-19. Institutional protocols and algorithms that pertain to the evaluation of patients at risk for COVID-19 are in a state of rapid change based on information released by regulatory bodies including the CDC and federal and state organizations. These policies and algorithms were followed during the patient's care in the ED.  Some ED evaluations and interventions may be delayed as a result of limited staffing during the pandemic.*  Note:  This document was prepared using Dragon voice recognition software and may include unintentional dictation errors.   Hinda Kehr, MD 04/21/19 [phone]

## 2019-04-22 ENCOUNTER — Encounter: Payer: Self-pay | Admitting: Obstetrics and Gynecology

## 2019-07-25 ENCOUNTER — Other Ambulatory Visit: Payer: Self-pay

## 2019-07-25 ENCOUNTER — Emergency Department
Admission: EM | Admit: 2019-07-25 | Discharge: 2019-07-25 | Disposition: A | Discharge status: Home / Self Care | Payer: Medicaid Other | Attending: Emergency Medicine | Admitting: Emergency Medicine

## 2019-07-25 DIAGNOSIS — T407X5A Adverse effect of cannabis (derivatives), initial encounter: Secondary | ICD-10-CM | POA: Insufficient documentation

## 2019-07-25 DIAGNOSIS — F410 Panic disorder [episodic paroxysmal anxiety] without agoraphobia: Secondary | ICD-10-CM | POA: Diagnosis not present

## 2019-07-25 DIAGNOSIS — Z79899 Other long term (current) drug therapy: Secondary | ICD-10-CM | POA: Insufficient documentation

## 2019-07-25 DIAGNOSIS — Z87891 Personal history of nicotine dependence: Secondary | ICD-10-CM | POA: Diagnosis not present

## 2019-07-25 DIAGNOSIS — R112 Nausea with vomiting, unspecified: Secondary | ICD-10-CM | POA: Insufficient documentation

## 2019-07-25 DIAGNOSIS — T50905A Adverse effect of unspecified drugs, medicaments and biological substances, initial encounter: Secondary | ICD-10-CM

## 2019-07-25 DIAGNOSIS — T887XXA Unspecified adverse effect of drug or medicament, initial encounter: Secondary | ICD-10-CM | POA: Diagnosis not present

## 2019-07-25 DIAGNOSIS — F419 Anxiety disorder, unspecified: Secondary | ICD-10-CM

## 2019-07-25 MED ORDER — ONDANSETRON 4 MG PO TBDP: ORAL_TABLET | ORAL | 0 refills | Status: DC

## 2019-07-25 MED ORDER — ONDANSETRON 4 MG PO TBDP
4.0000 mg | ORAL_TABLET | Freq: Once | ORAL | Status: AC
  Administered 2019-07-25: 4 mg via ORAL
  Filled 2019-07-25: qty 1

## 2019-07-25 NOTE — ED Notes (Signed)
Pt reports eating a large pot brownie which may or may not have been sitting in her car x 2 days.

## 2019-07-25 NOTE — ED Triage Notes (Signed)
Patient reports after "smoking" with someone about 20 minutes ago started to feel strange.  Reports feels like her mouth I dry and full of pills.

## 2019-07-25 NOTE — ED Provider Notes (Signed)
[facility] Emergency Department Provider Note  ____________________________________________   First MD Initiated Contact with Patient 07/25/19 [phone]     (approximate)  I have reviewed the triage vital signs and the nursing notes.   HISTORY  Chief Complaint Altered Mental Status    HPI Kaitlyn Richmond is a 24 y.o. female who presents for evaluation of feeling strange after eating a marijuana brownie.  She said that she thinks she was having a panic attack afterwards when she started to feel weird all over.  She has some nausea and vomiting.  She now feels better.  Initially her heart rate was in the 150s and she was having palpitations but that has subsided.  The symptoms were acute in onset and severe after eating the brownie but she now feels better and nothing in particular made her feel better or worse.  She is laughing with a friend who is at the bedside.  She denies pain.     No suicidal ideation, no drug use other than the brownie that she believes had marijuana in it.        Past Medical History:  Diagnosis Date  . Hx: UTI (urinary tract infection)     Patient Active Problem List   Diagnosis Date Noted  . Overweight 05/09/2018  . Indication for care in labor or delivery 10/02/2015    No past surgical history on file.  Prior to Admission medications   Medication Sig Start Date End Date Taking? Authorizing Provider  clindamycin (CLEOCIN) 300 MG capsule Take 1 capsule (300 mg total) 3 (three) times daily by mouth. 01/25/17   Irean Hong, MD  ibuprofen (ADVIL,MOTRIN) 600 MG tablet Take 1 tablet (600 mg total) by mouth every 6 (six) hours. 10/04/15   Marta Antu, CNM  magic mouthwash SOLN Take 5 mLs 3 (three) times daily as needed by mouth for mouth pain. 01/25/17   Irean Hong, MD  medroxyPROGESTERone (DEPO-PROVERA) 150 MG/ML injection Inject 1 mL (150 mg total) into the muscle every 3 (three) months. 10/04/15   Marta Antu, CNM    ondansetron (ZOFRAN ODT) 4 MG disintegrating tablet Allow 1-2 tablets to dissolve in your mouth every 8 hours as needed for nausea/vomiting 07/25/19   Loleta Rose, MD  oxyCODONE-acetaminophen (PERCOCET/ROXICET) 5-325 MG tablet Take 1 tablet by mouth every 4 (four) hours as needed (pain scale 4-7). 10/04/15   Brothers, Delaney Meigs, CNM  potassium chloride SA (KLOR-CON M20) 20 MEQ tablet Take 1 tablet (20 mEq total) by mouth daily. 04/21/19   Loleta Rose, MD    Allergies Penicillins and Shellfish allergy  Family History  Problem Relation Age of Onset  . Leukemia Maternal Uncle   . Diabetes Maternal Grandmother   . Hypertension Paternal Grandmother     Social History Social History   Tobacco Use  . Smoking status: Former Games developer  . Smokeless tobacco: Never Used  Substance Use Topics  . Alcohol use: Yes  . Drug use: Not on file    Review of Systems Constitutional: No fever/chills Eyes: No visual changes. ENT: No sore throat. Cardiovascular: Denies chest pain. Respiratory: Denies shortness of breath. Gastrointestinal: +N/V.  No abdominal pain.    No diarrhea.  No constipation. Genitourinary: Negative for dysuria. Musculoskeletal: Negative for neck pain.  Negative for back pain. Integumentary: Negative for rash. Neurological: Negative for headaches, focal weakness or numbness. Psychiatric: Panic/anxiety after eating marijuana brownie, now improved.  ____________________________________________   PHYSICAL EXAM:  VITAL SIGNS: ED Triage Vitals  Enc Vitals Group     BP 07/25/19 0243 (!) 123/97     Pulse Rate 07/25/19 0243 (!) 139     Resp 07/25/19 0243 (!) 22     Temp --      Temp src --      SpO2 07/25/19 0243 99 %     Weight 07/25/19 0241 68 kg (150 lb)     Height 07/25/19 0241 1.575 m (5\' 2" )     Head Circumference --      Peak Flow --      Pain Score 07/25/19 0241 0     Pain Loc --      Pain Edu? --      Excl. in [place]? --     Constitutional: Alert and oriented.  Appears slightly intoxicated or under the influence, but not dangerously so.  Sober friend is at bedside. Eyes: Conjunctivae are normal.  Head: Atraumatic. Nose: No congestion/rhinnorhea. Mouth/Throat: Patient is wearing a mask. Neck: No stridor.  No meningeal signs.   Cardiovascular: Normal rate, regular rhythm. Good peripheral circulation. Grossly normal heart sounds. Respiratory: Normal respiratory effort.  No retractions. Gastrointestinal: Soft and nontender. No distention.  Musculoskeletal: No lower extremity tenderness nor edema. No gross deformities of extremities. Neurologic:  Normal speech and language. No gross focal neurologic deficits are appreciated.  Skin:  Skin is warm, dry and intact. Psychiatric: Mood and affect are appropriate, laughing and joking with me, no warning signs.  ____________________________________________   LABS (all labs ordered are listed, but only abnormal results are displayed)  Labs Reviewed - No data to display ____________________________________________  EKG  No indication for EKG. ____________________________________________  RADIOLOGY Ursula Alert, personally viewed and evaluated these images (plain radiographs) as part of my medical decision making, as well as reviewing the written report by the radiologist.  ED MD interpretation:  No indication for emergent imaging.  Official radiology report(s): No results found.  ____________________________________________   PROCEDURES   Procedure(s) performed (including Critical Care):  Procedures   ____________________________________________   INITIAL IMPRESSION / MDM / ASSESSMENT AND PLAN / ED COURSE  As part of my medical decision making, I reviewed the following data within the [place] History obtained from family, Nursing notes reviewed and incorporated, Old chart reviewed and Notes from prior ED visits   Differential diagnosis includes, but is not  limited to, drug/medication side effect, electrolyte or metabolic abnormality, acute psychiatric illness.  The patient was initially quite tachycardic but she has calmed down.  She is tolerating oral intake in the ED.  I offered Zofran 4 mg ODT p.o. as well as Ativan 1 mg by mouth, but she declines the Ativan and says she feels well enough she wants to leave.  Her friend is with her and is comfortable with the plan and I see no medical neuropsychiatric indication to keep her.  She is hemodynamically stable and able to make her own decisions.  I believe she is safe for discharge.           ____________________________________________  FINAL CLINICAL IMPRESSION(S) / ED DIAGNOSES  Final diagnoses:  Drug side effects, initial encounter  Anxiety  Non-intractable vomiting with nausea, unspecified vomiting type     MEDICATIONS GIVEN DURING THIS VISIT:  Medications  ondansetron (ZOFRAN-ODT) disintegrating tablet 4 mg (has no administration in time range)     ED Discharge Orders         Ordered    ondansetron (ZOFRAN ODT) 4 MG disintegrating tablet  07/25/19 0334          *Please note:  AUBRIE LUCIEN was evaluated in Emergency Department on 07/25/2019 for the symptoms described in the history of present illness. She was evaluated in the context of the global COVID-19 pandemic, which necessitated consideration that the patient might be at risk for infection with the SARS-CoV-2 virus that causes COVID-19. Institutional protocols and algorithms that pertain to the evaluation of patients at risk for COVID-19 are in a state of rapid change based on information released by regulatory bodies including the CDC and federal and state organizations. These policies and algorithms were followed during the patient's care in the ED.  Some ED evaluations and interventions may be delayed as a result of limited staffing during the pandemic.*  Note:  This document was prepared using Dragon voice  recognition software and may include unintentional dictation errors.   Loleta Rose, MD 07/25/19 [phone]

## 2019-09-25 ENCOUNTER — Ambulatory Visit: Payer: Self-pay

## 2019-09-29 ENCOUNTER — Ambulatory Visit: Payer: Self-pay

## 2019-10-25 ENCOUNTER — Encounter: Payer: Self-pay | Admitting: Emergency Medicine

## 2019-10-25 ENCOUNTER — Other Ambulatory Visit: Payer: Self-pay

## 2019-10-25 DIAGNOSIS — M545 Low back pain: Secondary | ICD-10-CM | POA: Diagnosis not present

## 2019-10-25 DIAGNOSIS — Y92410 Unspecified street and highway as the place of occurrence of the external cause: Secondary | ICD-10-CM | POA: Diagnosis not present

## 2019-10-25 DIAGNOSIS — Y999 Unspecified external cause status: Secondary | ICD-10-CM | POA: Insufficient documentation

## 2019-10-25 DIAGNOSIS — Y939 Activity, unspecified: Secondary | ICD-10-CM | POA: Diagnosis not present

## 2019-10-25 DIAGNOSIS — R519 Headache, unspecified: Secondary | ICD-10-CM | POA: Diagnosis not present

## 2019-10-25 DIAGNOSIS — Z5321 Procedure and treatment not carried out due to patient leaving prior to being seen by health care provider: Secondary | ICD-10-CM | POA: Insufficient documentation
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
## 2019-10-26 ENCOUNTER — Emergency Department
Admission: EM | Admit: 2019-10-26 | Discharge: 2019-10-26 | Disposition: A | Discharge status: Home / Self Care | Payer: Medicaid Other | Attending: Emergency Medicine | Admitting: Emergency Medicine

## 2019-10-26 NOTE — ED Notes (Signed)
Observed walking out of ED lobby, did not speak to staff. 

## 2019-12-01 ENCOUNTER — Other Ambulatory Visit: Payer: Self-pay

## 2019-12-01 ENCOUNTER — Ambulatory Visit (LOCAL_COMMUNITY_HEALTH_CENTER): Payer: Medicaid Other

## 2019-12-01 VITALS — BP 106/68 | Ht 62.0 in | Wt 139.5 lb

## 2019-12-01 DIAGNOSIS — Z3201 Encounter for pregnancy test, result positive: Secondary | ICD-10-CM

## 2019-12-01 LAB — PREGNANCY, URINE: Preg Test, Ur: POSITIVE — AB

## 2019-12-01 MED ORDER — PRENATAL VITAMIN 27-0.8 MG PO TABS: 1.0000 | ORAL_TABLET | ORAL | 0 refills | Status: AC

## 2019-12-01 NOTE — Progress Notes (Signed)
Patient desires her prenatal care at [facility]. Hart Carwin, RN

## 2019-12-14 ENCOUNTER — Telehealth: Payer: Self-pay

## 2019-12-14 NOTE — Telephone Encounter (Signed)
TC to patient for f/u of +PT. Patient has new OB appointments scheduled at [facility] on 10/12 and [facility] on 10/07. Patient states she is planning maternity care at [facility] and will cancel [facility] appointment.Burt Knack, RN

## 2019-12-24 ENCOUNTER — Other Ambulatory Visit (HOSPITAL_COMMUNITY)
Admission: RE | Admit: 2019-12-24 | Discharge: 2019-12-24 | Disposition: A | Discharge status: Home / Self Care | Payer: Medicaid Other | Source: Ambulatory Visit | Attending: Advanced Practice Midwife | Admitting: Advanced Practice Midwife

## 2019-12-24 ENCOUNTER — Other Ambulatory Visit: Payer: Self-pay | Admitting: Advanced Practice Midwife

## 2019-12-24 ENCOUNTER — Ambulatory Visit (INDEPENDENT_AMBULATORY_CARE_PROVIDER_SITE_OTHER): Payer: Medicaid Other

## 2019-12-24 ENCOUNTER — Ambulatory Visit (INDEPENDENT_AMBULATORY_CARE_PROVIDER_SITE_OTHER): Payer: Medicaid Other | Admitting: Advanced Practice Midwife

## 2019-12-24 ENCOUNTER — Other Ambulatory Visit: Payer: Self-pay

## 2019-12-24 ENCOUNTER — Encounter: Payer: Self-pay | Admitting: Advanced Practice Midwife

## 2019-12-24 VITALS — BP 122/74 | Wt 139.0 lb

## 2019-12-24 DIAGNOSIS — Z113 Encounter for screening for infections with a predominantly sexual mode of transmission: Secondary | ICD-10-CM | POA: Insufficient documentation

## 2019-12-24 DIAGNOSIS — Z3481 Encounter for supervision of other normal pregnancy, first trimester: Secondary | ICD-10-CM

## 2019-12-24 DIAGNOSIS — Z124 Encounter for screening for malignant neoplasm of cervix: Secondary | ICD-10-CM | POA: Insufficient documentation

## 2019-12-24 DIAGNOSIS — Z3A08 8 weeks gestation of pregnancy: Secondary | ICD-10-CM

## 2019-12-24 DIAGNOSIS — Z3490 Encounter for supervision of normal pregnancy, unspecified, unspecified trimester: Secondary | ICD-10-CM | POA: Insufficient documentation

## 2019-12-24 NOTE — Patient Instructions (Signed)
Exercise During Pregnancy Exercise is an important part of being healthy for people of all ages. Exercise improves the function of your heart and lungs and helps you maintain strength, flexibility, and a healthy body weight. Exercise also boosts energy levels and elevates mood. Most women should exercise regularly during pregnancy. In rare cases, women with certain medical conditions or complications may be asked to limit or avoid exercise during pregnancy. How does this affect me? Along with maintaining general strength and flexibility, exercising during pregnancy can help:  Keep strength in muscles that are used during labor and childbirth.  Decrease low back pain.  Reduce symptoms of depression.  Control weight gain during pregnancy.  Reduce the risk of needing insulin if you develop diabetes during pregnancy.  Decrease the risk of cesarean delivery.  Speed up your recovery after giving birth. How does this affect my baby? Exercise can help you have a healthy pregnancy. Exercise does not cause premature birth. It will not cause your baby to weigh less at birth. What exercises can I do? Many exercises are safe for you to do during pregnancy. Do a variety of exercises that safely increase your heart and breathing rates and help you build and maintain muscle strength. Do exercises exactly as told by your health care provider. You may do these exercises:  Walking or hiking.  Swimming.  Water aerobics.  Riding a stationary bike.  Strength training.  Modified yoga or Pilates. Tell your instructor that you are pregnant. Avoid overstretching, and avoid lying on your back for long periods of time.  Running or jogging. Only choose this type of exercise if you: ? Ran or jogged regularly before your pregnancy. ? Can run or jog and still talk in complete sentences. What exercises should I avoid? Depending on your level of fitness and whether you exercised regularly before your  pregnancy, you may be told to limit high-intensity exercise. You can tell that you are exercising at a high intensity if you are breathing much harder and faster and cannot hold a conversation while exercising. You must avoid:  Contact sports.  Activities that put you at risk for falling on or being hit in the belly, such as downhill skiing, water skiing, surfing, rock climbing, cycling, gymnastics, and horseback riding.  Scuba diving.  Skydiving.  Yoga or Pilates in a room that is heated to high temperatures.  Jogging or running, unless you ran or jogged regularly before your pregnancy. While jogging or running, you should always be able to talk in full sentences. Do not run or jog so fast that you are unable to have a conversation.  Do not exercise at more than 6,000 feet above sea level (high elevation) if you are not used to exercising at high elevation. How do I exercise in a safe way?   Avoid overheating. Do not exercise in very high temperatures.  Wear loose-fitting, breathable clothes.  Avoid dehydration. Drink enough water before, during, and after exercise to keep your urine pale yellow.  Avoid overstretching. Because of hormone changes during pregnancy, it is easy to overstretch muscles, tendons, and ligaments during pregnancy.  Start slowly and ask your health care provider to recommend the types of exercise that are safe for you.  Do not exercise to lose weight. Follow these instructions at home:  Exercise on most days or all days of the week. Try to exercise for 30 minutes a day, 5 days a week, unless your health care provider tells you not to.  If   you actively exercised before your pregnancy and you are healthy, your health care provider may tell you to continue to do moderate to high-intensity exercise.  If you are just starting to exercise or did not exercise much before your pregnancy, your health care provider may tell you to do low to moderate-intensity  exercise. Questions to ask your health care provider  Is exercise safe for me?  What are signs that I should stop exercising?  Does my health condition mean that I should not exercise during pregnancy?  When should I avoid exercising during pregnancy? Stop exercising and contact a health care provider if: You have any unusual symptoms, such as:  Mild contractions of the uterus or cramps in the abdomen.  Dizziness that does not go away when you rest. Stop exercising and get help right away if: You have any unusual symptoms, such as:  Sudden, severe pain in your low back or your belly.  Mild contractions of the uterus or cramps in the abdomen that do not improve with rest and drinking fluids.  Chest pain.  Bleeding or fluid leaking from your vagina.  Shortness of breath. These symptoms may represent a serious problem that is an emergency. Do not wait to see if the symptoms will go away. Get medical help right away. Call your local emergency services (911 in the [place] Do not drive yourself to the hospital. Summary  Most women should exercise regularly throughout pregnancy. In rare cases, women with certain medical conditions or complications may be asked to limit or avoid exercise during pregnancy.  Do not exercise to lose weight during pregnancy.  Your health care provider will tell you what level of physical activity is right for you.  Stop exercising and contact a health care provider if you have mild contractions of the uterus or cramps in the abdomen. Get help right away if these contractions or cramps do not improve with rest and drinking fluids.  Stop exercising and get help right away if you have sudden, severe pain in your low back or belly, chest pain, shortness of breath, or bleeding or leaking of fluid from your vagina. This information is not intended to replace advice given to you by your health care provider. Make sure you discuss any questions you have with your  health care provider. Document Revised: 06/26/2018 Document Reviewed: 04/09/2018 Elsevier Patient Education  2020 [employer] Eating Plan for Pregnant Women While you are pregnant, your body requires additional nutrition to help support your growing baby. You also have a higher need for some vitamins and minerals, such as folic acid, calcium, iron, and vitamin D. Eating a healthy, well-balanced diet is very important for your health and your baby's health. Your need for extra calories varies for the three 3-month segments of your pregnancy (trimesters). For most women, it is recommended to consume:  150 extra calories a day during the first trimester.  300 extra calories a day during the second trimester.  300 extra calories a day during the third trimester. What are tips for following this plan?   Do not try to lose weight or go on a diet during pregnancy.  Limit your overall intake of foods that have "empty calories." These are foods that have little nutritional value, such as sweets, desserts, candies, and sugar-sweetened beverages.  Eat a variety of foods (especially fruits and vegetables) to get a full range of vitamins and minerals.  Take a prenatal vitamin to help meet your additional vitamin and mineral needs   during pregnancy, specifically for folic acid, iron, calcium, and vitamin D.  Remember to stay active. Ask your health care provider what types of exercise and activities are safe for you.  Practice good food safety and cleanliness. Wash your hands before you eat and after you prepare raw meat. Wash all fruits and vegetables well before peeling or eating. Taking these actions can help to prevent food-borne illnesses that can be very dangerous to your baby, such as listeriosis. Ask your health care provider for more information about listeriosis. What does 150 extra calories look like? Healthy options that provide 150 extra calories each day could be any of the  following:  6-8 oz (170-230 g) of plain low-fat yogurt with  cup of berries.  1 apple with 2 teaspoons (11 g) of peanut butter.  Cut-up vegetables with  cup (60 g) of hummus.  8 oz (230 mL) or 1 cup of low-fat chocolate milk.  1 stick of string cheese with 1 medium orange.  1 peanut butter and jelly sandwich that is made with one slice of whole-wheat bread and 1 tsp (5 g) of peanut butter. For 300 extra calories, you could eat two of those healthy options each day. What is a healthy amount of weight to gain? The right amount of weight gain for you is based on your BMI before you became pregnant. If your BMI:  Was less than 18 (underweight), you should gain 28-40 lb (13-18 kg).  Was 18-24.9 (normal), you should gain 25-35 lb (11-16 kg).  Was 25-29.9 (overweight), you should gain 15-25 lb (7-11 kg).  Was 30 or greater (obese), you should gain 11-20 lb (5-9 kg). What if I am having twins or multiples? Generally, if you are carrying twins or multiples:  You may need to eat 300-600 extra calories a day.  The recommended range for total weight gain is 25-54 lb (11-25 kg), depending on your BMI before pregnancy.  Talk with your health care provider to find out about nutritional needs, weight gain, and exercise that is right for you. What foods can I eat?  Fruits All fruits. Eat a variety of colors and types of fruit. Remember to wash your fruits well before peeling or eating. Vegetables All vegetables. Eat a variety of colors and types of vegetables. Remember to wash your vegetables well before peeling or eating. Grains All grains. Choose whole grains, such as whole-wheat bread, oatmeal, or brown rice. Meats and other protein foods Lean meats, including chicken, turkey, fish, and lean cuts of beef, veal, or pork. If you eat fish or seafood, choose options that are higher in omega-3 fatty acids and lower in mercury, such as salmon, herring, mussels, trout, sardines, pollock,  shrimp, crab, and lobster. Tofu. Tempeh. Beans. Eggs. Peanut butter and other nut butters. Make sure that all meats, poultry, and eggs are cooked to food-safe temperatures or "well-done." Two or more servings of fish are recommended each week in order to get the most benefits from omega-3 fatty acids that are found in seafood. Choose fish that are lower in mercury. You can find more information online:  www.fda.gov Dairy Pasteurized milk and milk alternatives (such as almond milk). Pasteurized yogurt and pasteurized cheese. Cottage cheese. Sour cream. Beverages Water. Juices that contain 100% fruit juice or vegetable juice. Caffeine-free teas and decaffeinated coffee. Drinks that contain caffeine are okay to drink, but it is better to avoid caffeine. Keep your total caffeine intake to less than 200 mg each day (which is 12 oz   or 355 mL of coffee, tea, or soda) or the limit as told by your health care provider. Fats and oils Fats and oils are okay to include in moderation. Sweets and desserts Sweets and desserts are okay to include in moderation. Seasoning and other foods All pasteurized condiments. The items listed above may not be a complete list of foods and beverages you can eat. Contact a dietitian for more information. What foods are not recommended? Fruits Unpasteurized fruit juices. Vegetables Raw (unpasteurized) vegetable juices. Meats and other protein foods Lunch meats, bologna, hot dogs, or other deli meats. (If you must eat those meats, reheat them until they are steaming hot.) Refrigerated pat, meat spreads from a meat counter, smoked seafood that is found in the refrigerated section of a store. Raw or undercooked meats, poultry, and eggs. Raw fish, such as sushi or sashimi. Fish that have high mercury content, such as tilefish, shark, swordfish, and king mackerel. To learn more about mercury in fish, talk with your health care provider or look for online resources, such  as:  www.fda.gov Dairy Raw (unpasteurized) milk and any foods that have raw milk in them. Soft cheeses, such as feta, queso blanco, queso fresco, Brie, Camembert cheeses, blue-veined cheeses, and Panela cheese (unless it is made with pasteurized milk, which must be stated on the label). Beverages Alcohol. Sugar-sweetened beverages, such as sodas, teas, or energy drinks. Seasoning and other foods Homemade fermented foods and drinks, such as pickles, sauerkraut, or kombucha drinks. (Store-bought pasteurized versions of these are okay.) Salads that are made in a store or deli, such as ham salad, chicken salad, egg salad, tuna salad, and seafood salad. The items listed above may not be a complete list of foods and beverages you should avoid. Contact a dietitian for more information. Where to find more information To calculate the number of calories you need based on your height, weight, and activity level, you can use an online calculator such as:  www.choosemyplate.gov/MyPlatePlan To calculate how much weight you should gain during pregnancy, you can use an online pregnancy weight gain calculator such as:  www.choosemyplate.gov/pregnancy-weight-gain-calculator Summary  While you are pregnant, your body requires additional nutrition to help support your growing baby.  Eat a variety of foods, especially fruits and vegetables to get a full range of vitamins and minerals.  Practice good food safety and cleanliness. Wash your hands before you eat and after you prepare raw meat. Wash all fruits and vegetables well before peeling or eating. Taking these actions can help to prevent food-borne illnesses, such as listeriosis, that can be very dangerous to your baby.  Do not eat raw meat or fish. Do not eat fish that have high mercury content, such as tilefish, shark, swordfish, and king mackerel. Do not eat unpasteurized (raw) dairy.  Take a prenatal vitamin to help meet your additional vitamin and  mineral needs during pregnancy, specifically for folic acid, iron, calcium, and vitamin D. This information is not intended to replace advice given to you by your health care provider. Make sure you discuss any questions you have with your health care provider. Document Revised: 07/24/2018 Document Reviewed: 11/30/2016 Elsevier Patient Education  2020 [employer]. Prenatal Care Prenatal care is health care during pregnancy. It helps you and your unborn baby (fetus) stay as healthy as possible. Prenatal care may be provided by a midwife, a family practice health care provider, or a childbirth and pregnancy specialist (obstetrician). How does this affect me? During pregnancy, you will be closely monitored   for any new conditions that might develop. To lower your risk of pregnancy complications, you and your health care provider will talk about any underlying conditions you have. How does this affect my baby? Early and consistent prenatal care increases the chance that your baby will be healthy during pregnancy. Prenatal care lowers the risk that your baby will be:  Born early (prematurely).  Smaller than expected at birth (small for gestational age). What can I expect at the first prenatal care visit? Your first prenatal care visit will likely be the longest. You should schedule your first prenatal care visit as soon as you know that you are pregnant. Your first visit is a good time to talk about any questions or concerns you have about pregnancy. At your visit, you and your health care provider will talk about:  Your medical history, including: ? Any past pregnancies. ? Your family's medical history. ? The baby's father's medical history. ? Any long-term (chronic) health conditions you have and how you manage them. ? Any surgeries or procedures you have had. ? Any current over-the-counter or prescription medicines, herbs, or supplements you are taking.  Other factors that could pose a risk  to your baby, including:  Your home setting and your stress levels, including: ? Exposure to abuse or violence. ? Household financial strain. ? Mental health conditions you have.  Your daily health habits, including diet and exercise. Your health care provider will also:  Measure your weight, height, and blood pressure.  Do a physical exam, including a pelvic and breast exam.  Perform blood tests and urine tests to check for: ? Urinary tract infection. ? Sexually transmitted infections (STIs). ? Low iron levels in your blood (anemia). ? Blood type and certain proteins on red blood cells (Rh antibodies). ? Infections and immunity to viruses, such as hepatitis B and rubella. ? HIV (human immunodeficiency virus).  Do an ultrasound to confirm your baby's growth and development and to help predict your estimated due date (EDD). This ultrasound is done with a probe that is inserted into the vagina (transvaginal ultrasound).  Discuss your options for genetic screening.  Give you information about how to keep yourself and your baby healthy, including: ? Nutrition and taking vitamins. ? Physical activity. ? How to manage pregnancy symptoms such as nausea and vomiting (morning sickness). ? Infections and substances that may be harmful to your baby and how to avoid them. ? Food safety. ? Dental care. ? Working. ? Travel. ? Warning signs to watch for and when to call your health care provider. How often will I have prenatal care visits? After your first prenatal care visit, you will have regular visits throughout your pregnancy. The visit schedule is often as follows:  Up to week 28 of pregnancy: once every 4 weeks.  28-36 weeks: once every 2 weeks.  After 36 weeks: every week until delivery. Some women may have visits more or less often depending on any underlying health conditions and the health of the baby. Keep all follow-up and prenatal care visits as told by your health care  provider. This is important. What happens during routine prenatal care visits? Your health care provider will:  Measure your weight and blood pressure.  Check for fetal heart sounds.  Measure the height of your uterus in your abdomen (fundal height). This may be measured starting around week 20 of pregnancy.  Check the position of your baby inside your uterus.  Ask questions about your diet, sleeping patterns, and   whether you can feel the baby move.  Review warning signs to watch for and signs of labor.  Ask about any pregnancy symptoms you are having and how you are dealing with them. Symptoms may include: ? Headaches. ? Nausea and vomiting. ? Vaginal discharge. ? Swelling. ? Fatigue. ? Constipation. ? Any discomfort, including back or pelvic pain. Make a list of questions to ask your health care provider at your routine visits. What tests might I have during prenatal care visits? You may have blood, urine, and imaging tests throughout your pregnancy, such as:  Urine tests to check for glucose, protein, or signs of infection.  Glucose tests to check for a form of diabetes that can develop during pregnancy (gestational diabetes mellitus). This is usually done around week 24 of pregnancy.  An ultrasound to check your baby's growth and development and to check for birth defects. This is usually done around week 20 of pregnancy.  A test to check for group B strep (GBS) infection. This is usually done around week 36 of pregnancy.  Genetic testing. This may include blood or imaging tests, such as an ultrasound. Some genetic tests are done during the first trimester and some are done during the second trimester. What else can I expect during prenatal care visits? Your health care provider may recommend getting certain vaccines during pregnancy. These may include:  A yearly flu shot (annual influenza vaccine). This is especially important if you will be pregnant during flu  season.  Tdap (tetanus, diphtheria, pertussis) vaccine. Getting this vaccine during pregnancy can protect your baby from whooping cough (pertussis) after birth. This vaccine may be recommended between weeks 27 and 36 of pregnancy. Later in your pregnancy, your health care provider may give you information about:  Childbirth and breastfeeding classes.  Choosing a health care provider for your baby.  Umbilical cord banking.  Breastfeeding.  Birth control after your baby is born.  The hospital labor and delivery unit and how to tour it.  Registering at the hospital before you go into labor. Where to find more information  Office on Women's Health: womenshealth.gov  American Pregnancy Association: americanpregnancy.org  March of Dimes: marchofdimes.org Summary  Prenatal care helps you and your baby stay as healthy as possible during pregnancy.  Your first prenatal care visit will most likely be the longest.  You will have visits and tests throughout your pregnancy to monitor your health and your baby's health.  Bring a list of questions to your visits to ask your health care provider.  Make sure to keep all follow-up and prenatal care visits with your health care provider. This information is not intended to replace advice given to you by your health care provider. Make sure you discuss any questions you have with your health care provider. Document Revised: 06/25/2018 Document Reviewed: 03/04/2017 Elsevier Patient Education  2020 [employer]  

## 2019-12-24 NOTE — Progress Notes (Signed)
New Obstetric Patient H&P    Chief Complaint: "Desires prenatal care"   History of Present Illness: Patient is a 24 y.o. [id number] Not Hispanic or Latino female, presents with amenorrhea and positive home pregnancy test. Patient's last menstrual period was 10/28/2019 (exact date). and based on her  LMP, her EDD is Estimated Date of Delivery: 08/03/20 and her EGA is 4952w1d. Cycles are 5 days, regular, and occur approximately every : 28 days. Her last pap smear was 4 years ago and was no abnormalities.    She had a urine pregnancy test which was positive 4 week(s)  ago. Her last menstrual period was normal and lasted for  5 day(s). Since her LMP she claims she has experienced breast tenderness, fatigue, nausea, vomiting. She denies vaginal bleeding. Her past medical history is noncontributory. Her prior pregnancies are notable for G1 2016 38w SVD female 5 pounds, G2 2017 39w SVD female 6#13oz, G3 TAB 2019, G4 SAB 2021  Since her LMP, she admits to the use of tobacco products  no She claims she has gained no pounds since the start of her pregnancy.  There are cats in the home in the home  no  She admits close contact with children on a regular basis  yes  She has had chicken pox in the past yes She has had Tuberculosis exposures, symptoms, or previously tested positive for TB   no Current or past history of domestic violence. no  Genetic Screening/Teratology Counseling: (Includes patient, baby's father, or anyone in either family with:)   1. [facility]  [facility] 2. Thalassemia ([place], [place], Mediterranean, or Asian background): MCV<80  no 3. Neural tube defect (meningomyelocele, spina bifida, anencephaly)  no 4. Congenital heart defect  no  5. Down syndrome  no 6. Tay-Sachs (Jewish, [place] Canadian)  no 7. Canavan's Disease  no 8. Sickle cell disease or trait (African)  no  9. Hemophilia or other blood disorders  no  10. Muscular dystrophy  no  11. Cystic fibrosis  no  12.  Huntington's Chorea  no  13. Mental retardation/autism  no 14. Other inherited genetic or chromosomal disorder  no 15. Maternal metabolic disorder (DM, PKU, etc)  no 16. Patient or FOB with a child with a birth defect not listed above no  16a. Patient or FOB with a birth defect themselves no 17. Recurrent pregnancy loss, or stillbirth  no  18. Any medications since LMP other than prenatal vitamins (include vitamins, supplements, OTC meds, drugs, alcohol)  Reports she quit using marijuana with positive pregnancy test 19. Any other genetic/environmental exposure to discuss  no  Infection History:   1. Lives with someone with TB or TB exposed  no  2. Patient or partner has history of genital herpes  no 3. Rash or viral illness since LMP  no 4. History of STI (GC, CT, HPV, syphilis, HIV)  no 5. History of recent travel :  no  Other pertinent information:  no     Review of Systems:10 point review of systems negative unless otherwise noted in HPI  Past Medical History:  Patient Active Problem List   Diagnosis Date Noted  . Supervision of normal pregnancy 12/24/2019    Clinic [facility] Prenatal Labs  Dating EDD by LMP c/w 8w u/s Blood type:     Genetic Screen 1 Screen:    AFP:     Quad:     NIPS: Antibody:   Anatomic US  Rubella:  Varicella: @VZVIGG @  GTT Early: NA               Third trimester:  RPR:     Rhogam  HBsAg:     Vaccines TDAP:                       Flu Shot: Covid: HIV:     Baby Food Plans to start w breast                               GBS:   GC/CT:  Contraception  Pap: 12/24/2019  CBB     CS/VBAC NA   Support Person Shilow       . Indication for care in labor or delivery 10/02/2015    Past Surgical History:  History reviewed. No pertinent surgical history.  Gynecologic History: Patient's last menstrual period was 10/28/2019 (exact date).  Obstetric History: 12/28/2019  Family History:  Family History  Problem Relation Age of Onset  . Leukemia Maternal  Uncle   . Diabetes Maternal Grandmother   . Hypertension Paternal Grandmother     Social History:  Social History   Socioeconomic History  . Marital status: Single    Spouse name: Not on file  . Number of children: Not on file  . Years of education: Not on file  . Highest education level: Not on file  Occupational History  . Not on file  Tobacco Use  . Smoking status: Former [id number]  . Smokeless tobacco: Never Used  Substance and Sexual Activity  . Alcohol use: Not Currently  . Drug use: Never  . Sexual activity: Yes    Birth control/protection: None  Other Topics Concern  . Not on file  Social History Narrative  . Not on file   Social Determinants of Health   Financial Resource Strain:   . Difficulty of Paying Living Expenses: Not on file  Food Insecurity:   . Worried About Games developer in the Last Year: Not on file  . Ran Out of Food in the Last Year: Not on file  Transportation Needs:   . Lack of Transportation (Medical): Not on file  . Lack of Transportation (Non-Medical): Not on file  Physical Activity:   . Days of Exercise per Week: Not on file  . Minutes of Exercise per Session: Not on file  Stress:   . Feeling of Stress : Not on file  Social Connections:   . Frequency of Communication with Friends and Family: Not on file  . Frequency of Social Gatherings with Friends and Family: Not on file  . Attends Religious Services: Not on file  . Active Member of Clubs or Organizations: Not on file  . Attends Programme researcher, broadcasting/film/video Meetings: Not on file  . Marital Status: Not on file  Intimate Partner Violence: Not At Risk  . Fear of Current or Ex-Partner: No  . Emotionally Abused: No  . Physically Abused: No  . Sexually Abused: No    Allergies:  Allergies  Allergen Reactions  . Penicillins Anaphylaxis  . Shellfish Allergy Swelling    Medications: Prior to Admission medications   Medication Sig Start Date End Date Taking? Authorizing Provider    Prenatal Vit-Fe Fumarate-FA (PRENATAL VITAMIN PO) Take by mouth.   Yes [provider]  clindamycin (CLEOCIN) 300 MG capsule Take 1 capsule (300 mg total) 3 (three) times daily by mouth. Patient not taking:  Reported on 12/01/2019 01/25/17   Irean Hong, MD  ibuprofen (ADVIL,MOTRIN) 600 MG tablet Take 1 tablet (600 mg total) by mouth every 6 (six) hours. Patient not taking: Reported on 12/01/2019 10/04/15   Marta Antu, CNM  magic mouthwash SOLN Take 5 mLs 3 (three) times daily as needed by mouth for mouth pain. Patient not taking: Reported on 12/01/2019 01/25/17   Irean Hong, MD  medroxyPROGESTERone (DEPO-PROVERA) 150 MG/ML injection Inject 1 mL (150 mg total) into the muscle every 3 (three) months. Patient not taking: Reported on 12/01/2019 10/04/15   Marta Antu, CNM  ondansetron (ZOFRAN ODT) 4 MG disintegrating tablet Allow 1-2 tablets to dissolve in your mouth every 8 hours as needed for nausea/vomiting Patient not taking: Reported on 12/01/2019 07/25/19   Loleta Rose, MD  oxyCODONE-acetaminophen (PERCOCET/ROXICET) 5-325 MG tablet Take 1 tablet by mouth every 4 (four) hours as needed (pain scale 4-7). Patient not taking: Reported on 12/01/2019 10/04/15   Marta Antu, CNM  potassium chloride SA (KLOR-CON M20) 20 MEQ tablet Take 1 tablet (20 mEq total) by mouth daily. Patient not taking: Reported on 12/01/2019 04/21/19   Loleta Rose, MD    Physical Exam Vitals: Blood pressure 122/74, weight 139 lb (63 kg), last menstrual period 10/28/2019.  General: NAD HEENT: normocephalic, anicteric Thyroid: no enlargement, no palpable nodules Pulmonary: No increased work of breathing, CTAB Cardiovascular: RRR, distal pulses 2+ Abdomen: NABS, soft, non-tender, non-distended.  Umbilicus without lesions.  No hepatomegaly, splenomegaly or masses palpable. No evidence of hernia  Genitourinary:  External: Normal external female genitalia.  Normal urethral meatus, normal Bartholin's and  Skene's glands.    Vagina: Normal vaginal mucosa, no evidence of prolapse.    Cervix: Grossly normal in appearance, no bleeding, no CMT  Uterus:  Non-enlarged, mobile, normal contour.    Adnexa: ovaries non-enlarged, no adnexal masses  Rectal: deferred Extremities: no edema, erythema, or tenderness Neurologic: Grossly intact Psychiatric: mood appropriate, affect full  Dating scan today: [redacted]w[redacted]d, no adjustment of EDD for 5 day difference  The following were addressed during this visit:  Breastfeeding Education - Early initiation of breastfeeding    Comments: Keeps milk supply adequate, helps contract uterus and slow bleeding, and early milk is the perfect first food and is easy to digest.   - The importance of exclusive breastfeeding    Comments: Provides antibodies, Lower risk of breast and ovarian cancers, and type-2 diabetes,Helps your body recover, Reduced chance of SIDS.   - Risks of giving your baby anything other than breast milk if you are breastfeeding    Comments: Make the baby less content with breastfeeds, may make my baby more susceptible to illness, and may reduce my milk supply.   - The importance of early skin-to-skin contact    Comments: Keeps baby warm and secure, helps keep baby's blood sugar up and breathing steady, easier to bond and breastfeed, and helps calm baby.  - Rooming-in on a 24-hour basis    Comments: Easier to learn baby's feeding cues, easier to bond and get to know each other, and encourages milk production.   - Feeding on demand or baby-led feeding    Comments: Helps prevent breastfeeding complications, helps bring in good milk supply, prevents under or overfeeding, and helps baby feel content and satisfied   - Frequent feeding to help assure optimal milk production    Comments: Making a full supply of milk requires frequent removal of milk from breasts, infant will eat 8-12 times in 24 hours,  if separated from infant use breast massage, hand  expression and/ or pumping to remove milk from breasts.   - Effective positioning and attachment    Comments: Helps my baby to get enough breast milk, helps to produce an adequate milk supply, and helps prevent nipple pain and damage   - Exclusive breastfeeding for the first 6 months    Comments: Builds a healthy milk supply and keeps it up, protects baby from sickness and disease, and breastmilk has everything your baby needs for the first 6 months.    Assessment: 24 y.o. A1O8786 at [redacted]w[redacted]d by LMP presenting to initiate prenatal care  Plan: 1) Avoid alcoholic beverages. 2) Patient encouraged not to smoke.  3) Discontinue the use of all non-medicinal drugs and chemicals.  4) Take prenatal vitamins daily.  5) Nutrition, food safety (fish, cheese advisories, and high nitrite foods) and exercise discussed. 6) Hospital and practice style discussed with cross coverage system.  7) Genetic Screening, such as with 1st Trimester Screening, cell free fetal DNA, AFP testing, and Ultrasound, as well as with amniocentesis and CVS as appropriate, is discussed with patient. At the conclusion of today's visit patient requested genetic testing 8) Patient is asked about travel to areas at risk for the Zika virus, and counseled to avoid travel and exposure to mosquitoes or sexual partners who may have themselves been exposed to the virus. Testing is discussed, and will be ordered as appropriate.  9) PAPtima, urine culture, UDS, NOB panel, sickle cell screen, dating today 10) Return to clinic in 4 weeks for ROB, MaterniT 21   Tresea Mall, CNM [facility] 12/24/2019, 3:14 PM

## 2019-12-24 NOTE — Progress Notes (Signed)
NOB today. LMP: 10/28/2019

## 2019-12-26 LAB — URINE CULTURE

## 2019-12-28 LAB — RPR+RH+ABO+RUB AB+AB SCR+CB...
Antibody Screen: NEGATIVE
HIV Screen 4th Generation wRfx: NONREACTIVE
Hematocrit: 39.9 % (ref 34.0–46.6)
Hemoglobin: 13.5 g/dL (ref 11.1–15.9)
Hepatitis B Surface Ag: NEGATIVE
MCH: 29.7 pg (ref 26.6–33.0)
MCHC: 33.8 g/dL (ref 31.5–35.7)
MCV: 88 fL (ref 79–97)
Platelets: 259 10*3/uL (ref 150–450)
RBC: 4.54 x10E6/uL (ref 3.77–5.28)
RDW: 13.6 % (ref 11.7–15.4)
RPR Ser Ql: NONREACTIVE
Rh Factor: POSITIVE
Rubella Antibodies, IGG: 1.53 index (ref >0.99)
Varicella zoster IgG: 1124 index (ref >165)
WBC: 5.1 10*3/uL (ref 3.4–10.8)

## 2019-12-28 LAB — HGB FRACTIONATION CASCADE
Hgb A2: 2.4 % (ref 1.8–3.2)
Hgb A: 97.6 % (ref 96.4–98.8)
Hgb F: 0 % (ref 0.0–2.0)
Hgb S: 0 %

## 2019-12-29 LAB — CYTOLOGY - PAP
Chlamydia: POSITIVE — AB
Comment: NEGATIVE
Comment: NEGATIVE
Comment: NORMAL
Diagnosis: NEGATIVE
Neisseria Gonorrhea: NEGATIVE
Trichomonas: NEGATIVE

## 2019-12-30 ENCOUNTER — Telehealth: Payer: Self-pay

## 2019-12-30 ENCOUNTER — Other Ambulatory Visit: Payer: Self-pay | Admitting: Advanced Practice Midwife

## 2019-12-30 DIAGNOSIS — A749 Chlamydial infection, unspecified: Secondary | ICD-10-CM

## 2019-12-30 DIAGNOSIS — O98811 Other maternal infectious and parasitic diseases complicating pregnancy, first trimester: Secondary | ICD-10-CM

## 2019-12-30 MED ORDER — AZITHROMYCIN 500 MG PO TABS: 1000.0000 mg | ORAL_TABLET | Freq: Once | ORAL | 0 refills | Status: AC

## 2019-12-30 NOTE — Telephone Encounter (Signed)
Not sure what you mean by 2 Rx's.  I sent an Rx to [facility] this am for the patient to treat her chlamydia infection and I also told her that her partner would need to be treated.

## 2019-12-30 NOTE — Telephone Encounter (Signed)
Pt calling; was seen last Thurs; 2 rxs were supposed to have been sent to [facility]; pharm doesn't have rxs.  [phone]

## 2019-12-30 NOTE — Progress Notes (Signed)
Rx azithromycin sent to patient pharmacy to treat chlamydia infection. Left voicemail and mychart message sent regarding Rx and treatment.

## 2019-12-31 NOTE — Telephone Encounter (Signed)
My preference is that [facility] to [facility]. It's a little more complicated for me to treat him too due to reporting STDs, etc.

## 2019-12-31 NOTE — Telephone Encounter (Signed)
2 rxs means two prescriptions.

## 2019-12-31 NOTE — Telephone Encounter (Signed)
Patient reports she vomited up her meds. IY#[phone]

## 2019-12-31 NOTE — Telephone Encounter (Signed)
Right- I just don't know why there would be two Rx's. She only needs one to treat her chlamydia.

## 2020-01-01 ENCOUNTER — Other Ambulatory Visit: Payer: Self-pay

## 2020-01-01 ENCOUNTER — Other Ambulatory Visit: Payer: Self-pay | Admitting: Advanced Practice Midwife

## 2020-01-01 DIAGNOSIS — O219 Vomiting of pregnancy, unspecified: Secondary | ICD-10-CM

## 2020-01-01 DIAGNOSIS — A749 Chlamydial infection, unspecified: Secondary | ICD-10-CM

## 2020-01-01 LAB — URINE DRUG PANEL 7
Amphetamines, Urine: NEGATIVE ng/mL
Barbiturate Quant, Ur: NEGATIVE ng/mL
Benzodiazepine Quant, Ur: NEGATIVE ng/mL
Cannabinoid Quant, Ur: POSITIVE — AB
Cocaine (Metab.): NEGATIVE ng/mL
Opiate Quant, Ur: NEGATIVE ng/mL
PCP Quant, Ur: NEGATIVE ng/mL

## 2020-01-01 MED ORDER — DOXYLAMINE-PYRIDOXINE 10-10 MG PO TBEC: 2.0000 | DELAYED_RELEASE_TABLET | Freq: Every day | ORAL | 5 refills | Status: DC

## 2020-01-01 MED ORDER — AZITHROMYCIN 500 MG PO TABS: 1000.0000 mg | ORAL_TABLET | Freq: Once | ORAL | 0 refills | Status: DC

## 2020-01-01 MED ORDER — AZITHROMYCIN 500 MG PO TABS: 1000.0000 mg | ORAL_TABLET | Freq: Once | ORAL | 0 refills | Status: AC

## 2020-01-01 NOTE — Addendum Note (Signed)
Addended by: Tresea Mall on: 01/01/2020 04:34 PM   Modules accepted: Orders

## 2020-01-01 NOTE — Progress Notes (Signed)
Rx diclegis sent to patient pharmacy.

## 2020-01-01 NOTE — Progress Notes (Signed)
Reorder Azithromycin

## 2020-01-01 NOTE — Telephone Encounter (Signed)
Per [facility] rx sent in. LMVM to notify patient.

## 2020-01-01 NOTE — Telephone Encounter (Signed)
Delay in Rx's getting to pharmacy today due to E Rx not working.

## 2020-01-01 NOTE — Telephone Encounter (Signed)
Patient calling and would like to speak with a nurse.  CB# [phone]

## 2020-01-21 ENCOUNTER — Encounter: Payer: Self-pay | Admitting: Obstetrics and Gynecology

## 2020-01-21 ENCOUNTER — Other Ambulatory Visit (HOSPITAL_COMMUNITY)
Admission: RE | Admit: 2020-01-21 | Discharge: 2020-01-21 | Disposition: A | Discharge status: Home / Self Care | Payer: Medicaid Other | Source: Ambulatory Visit | Attending: Obstetrics and Gynecology | Admitting: Obstetrics and Gynecology

## 2020-01-21 ENCOUNTER — Ambulatory Visit (INDEPENDENT_AMBULATORY_CARE_PROVIDER_SITE_OTHER): Payer: Medicaid Other | Admitting: Obstetrics and Gynecology

## 2020-01-21 ENCOUNTER — Other Ambulatory Visit: Payer: Self-pay

## 2020-01-21 ENCOUNTER — Other Ambulatory Visit: Payer: Self-pay | Admitting: Obstetrics and Gynecology

## 2020-01-21 VITALS — BP 110/70 | Wt 138.6 lb

## 2020-01-21 DIAGNOSIS — Z1379 Encounter for other screening for genetic and chromosomal anomalies: Secondary | ICD-10-CM

## 2020-01-21 DIAGNOSIS — Z113 Encounter for screening for infections with a predominantly sexual mode of transmission: Secondary | ICD-10-CM | POA: Insufficient documentation

## 2020-01-21 DIAGNOSIS — Z3401 Encounter for supervision of normal first pregnancy, first trimester: Secondary | ICD-10-CM

## 2020-01-21 DIAGNOSIS — Z3A12 12 weeks gestation of pregnancy: Secondary | ICD-10-CM

## 2020-01-21 NOTE — Progress Notes (Signed)
Routine Prenatal Care Visit  Subjective  Kaitlyn Richmond is a 24 y.o. Z6X0960 at [redacted]w[redacted]d being seen today for ongoing prenatal care.  She is currently monitored for the following issues for this low-risk pregnancy and has Indication for care in labor or delivery and Supervision of normal pregnancy on their problem list.  ----------------------------------------------------------------------------------- Patient reports no complaints.   Contractions: Not present. Vag. Bleeding: None.  Movement: Absent. Denies leaking of fluid.  ----------------------------------------------------------------------------------- The following portions of the patient's history were reviewed and updated as appropriate: allergies, current medications, past family history, past medical history, past social history, past surgical history and problem list. Problem list updated.   Objective  Blood pressure 110/70, weight 138 lb 9.6 oz (62.9 kg), last menstrual period 10/28/2019. Pregravid weight 139 lb (63 kg) Total Weight Gain -6.4 oz (-0.181 kg) Urinalysis:      Fetal Status: Fetal Heart Rate (bpm): 160   Movement: Absent     General:  Alert, oriented and cooperative. Patient is in no acute distress.  Skin: Skin is warm and dry. No rash noted.   Cardiovascular: Normal heart rate noted  Respiratory: Normal respiratory effort, no problems with respiration noted  Abdomen: Soft, gravid, appropriate for gestational age. Pain/Pressure: Absent     Pelvic:  Cervical exam deferred        Extremities: Normal range of motion.     Mental Status: Normal mood and affect. Normal behavior. Normal judgment and thought content.     Assessment   24 y.o. A5W0981 at [redacted]w[redacted]d by  08/03/2020, by Last Menstrual Period presenting for routine prenatal visit  Plan   pregnancy Problems (from 12/24/19 to present)    Problem Noted Resolved   Supervision of normal pregnancy 12/24/2019 by Tresea Mall, CNM No   Overview Addendum  01/21/2020  9:47 AM by Natale Milch, MD     Nursing Staff Provider  Office Location  [facility] Dating   LMP = 8wk [place]  Language  English Anatomy US    Flu Vaccine   Genetic Screen  NIPS:     TDaP vaccine    Hgb A1C or  GTT Early : not needed Third trimester :   Rhogam   not needed   LAB RESULTS   Feeding Plan  Blood Type B/Positive/-- (10/07 1515)   Contraception  Antibody Negative (10/07 1515)  Circumcision  Rubella 1.53 (10/07 1515)  Pediatrician   RPR Non Reactive (10/07 1515)   Support Person  Shilow HBsAg Negative (10/07 1515)   Prenatal Classes  HIV Non Reactive (10/07 1515)    Varicella Immune  BTL Consent  GBS  (For PCN allergy, check sensitivities)        VBAC Consent  Pap      Hgb Electro    COVID  CF      SMA       Chlamydia positive in pregnancy   Pregnancy diagnoses Chlamydia positive in pregnancy Marijuana use in pregnancy        Previous Version      Maternit21 today TOC for chlamydia today Declines flu and covid vaccination Given information about COVID vaccination and Marijuana use in pregnancy  Gestational age appropriate obstetric precautions including but not limited to vaginal bleeding, contractions, leaking of fluid and fetal movement were reviewed in detail with the patient.    Return in about 4 weeks (around 02/18/2020) for ROB in person.  Natale Milch MD [facility], [facility] 01/21/2020, 9:59 AM

## 2020-01-21 NOTE — Progress Notes (Signed)
poc

## 2020-01-21 NOTE — Patient Instructions (Signed)
First Trimester of Pregnancy The first trimester of pregnancy is from week 1 until the end of week 13 (months 1 through 3). A week after a sperm fertilizes an egg, the egg will implant on the wall of the uterus. This embryo will begin to develop into a baby. Genes from you and your partner will form the baby. The female genes will determine whether the baby will be a boy or a girl. At 6-8 weeks, the eyes and face will be formed, and the heartbeat can be seen on ultrasound. At the end of 12 weeks, all the baby's organs will be formed. Now that you are pregnant, you will want to do everything you can to have a healthy baby. Two of the most important things are to get good prenatal care and to follow your health care provider's instructions. Prenatal care is all the medical care you receive before the baby's birth. This care will help prevent, find, and treat any problems during the pregnancy and childbirth. Body changes during your first trimester Your body goes through many changes during pregnancy. The changes vary from woman to woman.  You may gain or lose a couple of pounds at first.  You may feel sick to your stomach (nauseous) and you may throw up (vomit). If the vomiting is uncontrollable, call your health care provider.  You may tire easily.  You may develop headaches that can be relieved by medicines. All medicines should be approved by your health care provider.  You may urinate more often. Painful urination may mean you have a bladder infection.  You may develop heartburn as a result of your pregnancy.  You may develop constipation because certain hormones are causing the muscles that push stool through your intestines to slow down.  You may develop hemorrhoids or swollen veins (varicose veins).  Your breasts may begin to grow larger and become tender. Your nipples may stick out more, and the tissue that surrounds them (areola) may become darker.  Your gums may bleed and may be  sensitive to brushing and flossing.  Dark spots or blotches (chloasma, mask of pregnancy) may develop on your face. This will likely fade after the baby is born.  Your menstrual periods will stop.  You may have a loss of appetite.  You may develop cravings for certain kinds of food.  You may have changes in your emotions from day to day, such as being excited to be pregnant or being concerned that something may go wrong with the pregnancy and baby.  You may have more vivid and strange dreams.  You may have changes in your hair. These can include thickening of your hair, rapid growth, and changes in texture. Some women also have hair loss during or after pregnancy, or hair that feels dry or thin. Your hair will most likely return to normal after your baby is born. What to expect at prenatal visits During a routine prenatal visit:  You will be weighed to make sure you and the baby are growing normally.  Your blood pressure will be taken.  Your abdomen will be measured to track your baby's growth.  The fetal heartbeat will be listened to between weeks 10 and 14 of your pregnancy.  Test results from any previous visits will be discussed. Your health care provider may ask you:  How you are feeling.  If you are feeling the baby move.  If you have had any abnormal symptoms, such as leaking fluid, bleeding, severe headaches, or abdominal   cramping.  If you are using any tobacco products, including cigarettes, chewing tobacco, and electronic cigarettes.  If you have any questions. Other tests that may be performed during your first trimester include:  Blood tests to find your blood type and to check for the presence of any previous infections. The tests will also be used to check for low iron levels (anemia) and protein on red blood cells (Rh antibodies). Depending on your risk factors, or if you previously had diabetes during pregnancy, you may have tests to check for high blood sugar  that affects pregnant women (gestational diabetes).  Urine tests to check for infections, diabetes, or protein in the urine.  An ultrasound to confirm the proper growth and development of the baby.  Fetal screens for spinal cord problems (spina bifida) and Down syndrome.  HIV (human immunodeficiency virus) testing. Routine prenatal testing includes screening for HIV, unless you choose not to have this test.  You may need other tests to make sure you and the baby are doing well. Follow these instructions at home: Medicines  Follow your health care provider's instructions regarding medicine use. Specific medicines may be either safe or unsafe to take during pregnancy.  Take a prenatal vitamin that contains at least 600 micrograms (mcg) of folic acid.  If you develop constipation, try taking a stool softener if your health care provider approves. Eating and drinking   Eat a balanced diet that includes fresh fruits and vegetables, whole grains, good sources of protein such as meat, eggs, or tofu, and low-fat dairy. Your health care provider will help you determine the amount of weight gain that is right for you.  Avoid raw meat and uncooked cheese. These carry germs that can cause birth defects in the baby.  Eating four or five small meals rather than three large meals a day may help relieve nausea and vomiting. If you start to feel nauseous, eating a few soda crackers can be helpful. Drinking liquids between meals, instead of during meals, also seems to help ease nausea and vomiting.  Limit foods that are high in fat and processed sugars, such as fried and sweet foods.  To prevent constipation: ? Eat foods that are high in fiber, such as fresh fruits and vegetables, whole grains, and beans. ? Drink enough fluid to keep your urine clear or pale yellow. Activity  Exercise only as directed by your health care provider. Most women can continue their usual exercise routine during  pregnancy. Try to exercise for 30 minutes at least 5 days a week. Exercising will help you: ? Control your weight. ? Stay in shape. ? Be prepared for labor and delivery.  Experiencing pain or cramping in the lower abdomen or lower back is a good sign that you should stop exercising. Check with your health care provider before continuing with normal exercises.  Try to avoid standing for long periods of time. Move your legs often if you must stand in one place for a long time.  Avoid heavy lifting.  Wear low-heeled shoes and practice good posture.  You may continue to have sex unless your health care provider tells you not to. Relieving pain and discomfort  Wear a good support bra to relieve breast tenderness.  Take warm sitz baths to soothe any pain or discomfort caused by hemorrhoids. Use hemorrhoid cream if your health care provider approves.  Rest with your legs elevated if you have leg cramps or low back pain.  If you develop varicose veins in   your legs, wear support hose. Elevate your feet for 15 minutes, 3-4 times a day. Limit salt in your diet. Prenatal care  Schedule your prenatal visits by the twelfth week of pregnancy. They are usually scheduled monthly at first, then more often in the last 2 months before delivery.  Write down your questions. Take them to your prenatal visits.  Keep all your prenatal visits as told by your health care provider. This is important. Safety  Wear your seat belt at all times when driving.  Make a list of emergency phone numbers, including numbers for family, friends, the hospital, and police and fire departments. General instructions  Ask your health care provider for a referral to a local prenatal education class. Begin classes no later than the beginning of month 6 of your pregnancy.  Ask for help if you have counseling or nutritional needs during pregnancy. Your health care provider can offer advice or refer you to specialists for help  with various needs.  Do not use hot tubs, steam rooms, or saunas.  Do not douche or use tampons or scented sanitary pads.  Do not cross your legs for long periods of time.  Avoid cat litter boxes and soil used by cats. These carry germs that can cause birth defects in the baby and possibly loss of the fetus by miscarriage or stillbirth.  Avoid all smoking, herbs, alcohol, and medicines not prescribed by your health care provider. Chemicals in these products affect the formation and growth of the baby.  Do not use any products that contain nicotine or tobacco, such as cigarettes and e-cigarettes. If you need help quitting, ask your health care provider. You may receive counseling support and other resources to help you quit.  Schedule a dentist appointment. At home, brush your teeth with a soft toothbrush and be gentle when you floss. Contact a health care provider if:  You have dizziness.  You have mild pelvic cramps, pelvic pressure, or nagging pain in the abdominal area.  You have persistent nausea, vomiting, or diarrhea.  You have a bad smelling vaginal discharge.  You have pain when you urinate.  You notice increased swelling in your face, hands, legs, or ankles.  You are exposed to fifth disease or chickenpox.  You are exposed to German measles (rubella) and have never had it. Get help right away if:  You have a fever.  You are leaking fluid from your vagina.  You have spotting or bleeding from your vagina.  You have severe abdominal cramping or pain.  You have rapid weight gain or loss.  You vomit blood or material that looks like coffee grounds.  You develop a severe headache.  You have shortness of breath.  You have any kind of trauma, such as from a fall or a car accident. Summary  The first trimester of pregnancy is from week 1 until the end of week 13 (months 1 through 3).  Your body goes through many changes during pregnancy. The changes vary from  woman to woman.  You will have routine prenatal visits. During those visits, your health care provider will examine you, discuss any test results you may have, and talk with you about how you are feeling. This information is not intended to replace advice given to you by your health care provider. Make sure you discuss any questions you have with your health care provider. Document Revised: 02/15/2017 Document Reviewed: 02/15/2016 Elsevier Patient Education  2020 [employer]  

## 2020-01-26 LAB — MATERNIT21 PLUS CORE+SCA
Fetal Fraction: 8
Monosomy X (Turner Syndrome): NOT DETECTED
Result (T21): NEGATIVE
Trisomy 13 (Patau syndrome): NEGATIVE
Trisomy 18 (Edwards syndrome): NEGATIVE
Trisomy 21 (Down syndrome): NEGATIVE
XXX (Triple X Syndrome): NOT DETECTED
XXY (Klinefelter Syndrome): NOT DETECTED
XYY (Jacobs Syndrome): NOT DETECTED

## 2020-01-26 LAB — MOLECULAR ANCILLARY ONLY
Chlamydia: NEGATIVE
Comment: NEGATIVE
Comment: NORMAL
Neisseria Gonorrhea: NEGATIVE

## 2020-02-16 ENCOUNTER — Other Ambulatory Visit: Payer: Self-pay

## 2020-02-16 ENCOUNTER — Ambulatory Visit (INDEPENDENT_AMBULATORY_CARE_PROVIDER_SITE_OTHER): Payer: Medicaid Other | Admitting: Obstetrics and Gynecology

## 2020-02-16 VITALS — BP 112/58 | Temp 98.5°F | Wt 140.0 lb

## 2020-02-16 DIAGNOSIS — R197 Diarrhea, unspecified: Secondary | ICD-10-CM

## 2020-02-16 DIAGNOSIS — Z3A15 15 weeks gestation of pregnancy: Secondary | ICD-10-CM

## 2020-02-16 DIAGNOSIS — Z3482 Encounter for supervision of other normal pregnancy, second trimester: Secondary | ICD-10-CM

## 2020-02-16 DIAGNOSIS — O219 Vomiting of pregnancy, unspecified: Secondary | ICD-10-CM

## 2020-02-16 DIAGNOSIS — O26899 Other specified pregnancy related conditions, unspecified trimester: Secondary | ICD-10-CM | POA: Insufficient documentation

## 2020-02-16 LAB — POCT URINALYSIS DIPSTICK OB: Glucose, UA: NEGATIVE

## 2020-02-16 NOTE — Progress Notes (Signed)
Routine Prenatal Care Visit  Subjective  Kaitlyn Richmond is a 24 y.o. N4B0962 at [redacted]w[redacted]d being seen today for ongoing prenatal care.  She is currently monitored for the following issues for this low-risk pregnancy and has Indication for care in labor or delivery; Supervision of normal pregnancy; Nausea and vomiting during pregnancy; and Diarrhea during pregnancy on their problem list.  ----------------------------------------------------------------------------------- Patient reports diarrhea, nausea and vomiting. Patient states that yesterday she had two episodes of diarrhea and one episode of emesis. Today she reports two episodes of emesis associated with chills but denies fevers. Patient reports episodes of N/V early in pregnancy but had since resolved. Patient tolerating oral intake today.  Contractions: Not present. Vag. Bleeding: None.  Movement: Absent. Denies leaking of fluid.  ----------------------------------------------------------------------------------- The following portions of the patient's history were reviewed and updated as appropriate: allergies, current medications, past family history, past medical history, past social history, past surgical history and problem list. Problem list updated.   Objective  Last menstrual period 10/28/2019. Pregravid weight 139 lb (63 kg) Total Weight Gain 1 lb (0.454 kg) Urinalysis:      Fetal Status: Fetal Heart Rate (bpm): 145   Movement: Absent     General:  Alert, oriented and cooperative. Patient is in no acute distress.  Skin: Skin is warm and dry. No rash noted.   Cardiovascular: Normal heart rate noted  Respiratory: Normal respiratory effort, no problems with respiration noted  Abdomen: Soft, gravid, appropriate for gestational age. Pain/Pressure: Absent     Pelvic:  Cervical exam deferred        Extremities: Normal range of motion.     ental Status: Normal mood and affect. Normal behavior. Normal judgment and thought content.      Assessment   24 y.o. [id number] at [redacted]w[redacted]d by  08/03/2020, by Last Menstrual Period presenting for work-in prenatal visit  Plan   pregnancy Problems (from 12/24/19 to present)    Problem Noted Resolved   Supervision of normal pregnancy 12/24/2019 by Tresea Mall, CNM No   Overview Addendum 01/21/2020  9:47 AM by Natale Milch, MD     Nursing Staff Provider  Office Location  [facility] Dating   LMP = 8wk [place]  Language  English Anatomy US    Flu Vaccine   Genetic Screen  NIPS:     TDaP vaccine    Hgb A1C or  GTT Early : not needed Third trimester :   Rhogam   not needed   LAB RESULTS   Feeding Plan  Blood Type B/Positive/-- (10/07 1515)   Contraception  Antibody Negative (10/07 1515)  Circumcision  Rubella 1.53 (10/07 1515)  Pediatrician   RPR Non Reactive (10/07 1515)   Support Person  [place] HBsAg Negative (10/07 1515)   Prenatal Classes  HIV Non Reactive (10/07 1515)    Varicella Immune  BTL Consent  GBS  (For PCN allergy, check sensitivities)        VBAC Consent  Pap      Hgb Electro    COVID  CF      SMA       Chlamydia positive in pregnancy   Pregnancy diagnoses Chlamydia positive in pregnancy Marijuana use in pregnancy        Previous Version      -Patient with new onset nausea/vomiting and diarrhea. Oral hydration encouraged. Tylenol and rest today. If symptoms do not improve within 24 hours or unable to tolerate oral intake - plan made to follow-up.  Gestational age appropriate obstetric precautions including but not limited to vaginal bleeding, contractions, leaking of fluid and fetal movement were reviewed in detail with the patient.    Keep future ROB appointments.  Zipporah Plants, CNM, MSN [facility], [facility] 02/16/2020, 3:36 PM

## 2020-02-16 NOTE — Progress Notes (Signed)
ROB - weakness, hotflashes, and vomiting. Per patient just not feeling well the last couple of days. RM 5

## 2020-02-17 ENCOUNTER — Encounter: Payer: Medicaid Other | Admitting: Obstetrics

## 2020-02-22 ENCOUNTER — Telehealth: Payer: Self-pay

## 2020-02-22 ENCOUNTER — Encounter: Payer: Self-pay | Admitting: Obstetrics and Gynecology

## 2020-02-22 NOTE — Telephone Encounter (Signed)
Error

## 2020-02-25 ENCOUNTER — Ambulatory Visit (INDEPENDENT_AMBULATORY_CARE_PROVIDER_SITE_OTHER): Payer: Medicaid Other | Admitting: Obstetrics and Gynecology

## 2020-02-25 ENCOUNTER — Encounter: Payer: Self-pay | Admitting: Obstetrics and Gynecology

## 2020-02-25 ENCOUNTER — Other Ambulatory Visit: Payer: Self-pay

## 2020-02-25 VITALS — BP 126/76 | HR 87 | Wt 141.1 lb

## 2020-02-25 DIAGNOSIS — O26899 Other specified pregnancy related conditions, unspecified trimester: Secondary | ICD-10-CM

## 2020-02-25 DIAGNOSIS — O219 Vomiting of pregnancy, unspecified: Secondary | ICD-10-CM

## 2020-02-25 DIAGNOSIS — Z3482 Encounter for supervision of other normal pregnancy, second trimester: Secondary | ICD-10-CM

## 2020-02-25 DIAGNOSIS — R197 Diarrhea, unspecified: Secondary | ICD-10-CM

## 2020-02-25 DIAGNOSIS — Z3A17 17 weeks gestation of pregnancy: Secondary | ICD-10-CM

## 2020-02-25 LAB — POCT URINALYSIS DIPSTICK OB
Bilirubin, UA: NEGATIVE
Blood, UA: NEGATIVE
Glucose, UA: NEGATIVE
Ketones, UA: NEGATIVE
Nitrite, UA: NEGATIVE
POC,PROTEIN,UA: NEGATIVE
Spec Grav, UA: 1.015 (ref 1.010–1.025)
Urobilinogen, UA: 0.2 E.U./dL
pH, UA: 6.5 (ref 5.0–8.0)

## 2020-02-25 NOTE — Progress Notes (Signed)
ROB: Patient complains of occasional headaches.  We discussed hydration and use of Tylenol.  Patient transferring from [facility] for continuation of prenatal care.  2 previous vaginal births as noted.  Patient states they were uncomplicated.  Declined AFP testing.

## 2020-02-25 NOTE — Addendum Note (Signed)
Addended by: Dorian Pod on: 02/25/2020 03:43 PM   Modules accepted: Orders

## 2020-03-16 ENCOUNTER — Encounter: Payer: Self-pay | Admitting: Obstetrics and Gynecology

## 2020-03-16 ENCOUNTER — Ambulatory Visit (INDEPENDENT_AMBULATORY_CARE_PROVIDER_SITE_OTHER): Payer: Medicaid Other | Admitting: Obstetrics and Gynecology

## 2020-03-16 ENCOUNTER — Ambulatory Visit (INDEPENDENT_AMBULATORY_CARE_PROVIDER_SITE_OTHER): Payer: Medicaid Other

## 2020-03-16 ENCOUNTER — Other Ambulatory Visit: Payer: Self-pay

## 2020-03-16 VITALS — BP 115/76 | HR 98 | Wt 141.2 lb

## 2020-03-16 DIAGNOSIS — Z3482 Encounter for supervision of other normal pregnancy, second trimester: Secondary | ICD-10-CM

## 2020-03-16 DIAGNOSIS — Z36 Encounter for antenatal screening for chromosomal anomalies: Secondary | ICD-10-CM

## 2020-03-16 DIAGNOSIS — Z3A20 20 weeks gestation of pregnancy: Secondary | ICD-10-CM

## 2020-03-16 DIAGNOSIS — Z3A17 17 weeks gestation of pregnancy: Secondary | ICD-10-CM | POA: Diagnosis not present

## 2020-03-16 LAB — POCT URINALYSIS DIPSTICK OB
Bilirubin, UA: NEGATIVE
Blood, UA: NEGATIVE
Glucose, UA: NEGATIVE
Ketones, UA: NEGATIVE
Nitrite, UA: NEGATIVE
POC,PROTEIN,UA: NEGATIVE
Spec Grav, UA: <=1.005 — AB (ref 1.010–1.025)
Urobilinogen, UA: 0.2 E.U./dL
pH, UA: 6 (ref 5.0–8.0)

## 2020-03-16 NOTE — Progress Notes (Signed)
ROB-Pt present for routine prenatal care. Pt c/o braxton hick contractions.

## 2020-03-16 NOTE — Patient Instructions (Signed)
Second Trimester of Pregnancy The second trimester is from week 14 through week 27 (months 4 through 6). The second trimester is often a time when you feel your best. Your body has adjusted to being pregnant, and you begin to feel better physically. Usually, morning sickness has lessened or quit completely, you may have more energy, and you may have an increase in appetite. The second trimester is also a time when the fetus is growing rapidly. At the end of the sixth month, the fetus is about 9 inches long and weighs about 1 pounds. You will likely begin to feel the baby move (quickening) between 16 and 20 weeks of pregnancy. Body changes during your second trimester Your body continues to go through many changes during your second trimester. The changes vary from woman to woman.  Your weight will continue to increase. You will notice your lower abdomen bulging out.  You may begin to get stretch marks on your hips, abdomen, and breasts.  You may develop headaches that can be relieved by medicines. The medicines should be approved by your health care provider.  You may urinate more often because the fetus is pressing on your bladder.  You may develop or continue to have heartburn as a result of your pregnancy.  You may develop constipation because certain hormones are causing the muscles that push waste through your intestines to slow down.  You may develop hemorrhoids or swollen, bulging veins (varicose veins).  You may have back pain. This is caused by: ? Weight gain. ? Pregnancy hormones that are relaxing the joints in your pelvis. ? A shift in weight and the muscles that support your balance.  Your breasts will continue to grow and they will continue to become tender.  Your gums may bleed and may be sensitive to brushing and flossing.  Dark spots or blotches (chloasma, mask of pregnancy) may develop on your face. This will likely fade after the baby is born.  A dark line from your  belly button to the pubic area (linea nigra) may appear. This will likely fade after the baby is born.  You may have changes in your hair. These can include thickening of your hair, rapid growth, and changes in texture. Some women also have hair loss during or after pregnancy, or hair that feels dry or thin. Your hair will most likely return to normal after your baby is born. What to expect at prenatal visits During a routine prenatal visit:  You will be weighed to make sure you and the fetus are growing normally.  Your blood pressure will be taken.  Your abdomen will be measured to track your baby's growth.  The fetal heartbeat will be listened to.  Any test results from the previous visit will be discussed. Your health care provider may ask you:  How you are feeling.  If you are feeling the baby move.  If you have had any abnormal symptoms, such as leaking fluid, bleeding, severe headaches, or abdominal cramping.  If you are using any tobacco products, including cigarettes, chewing tobacco, and electronic cigarettes.  If you have any questions. Other tests that may be performed during your second trimester include:  Blood tests that check for: ? Low iron levels (anemia). ? High blood sugar that affects pregnant women (gestational diabetes) between 35 and 28 weeks. ? Rh antibodies. This is to check for a protein on red blood cells (Rh factor).  Urine tests to check for infections, diabetes, or protein in the  urine.  An ultrasound to confirm the proper growth and development of the baby.  An amniocentesis to check for possible genetic problems.  Fetal screens for spina bifida and Down syndrome.  HIV (human immunodeficiency virus) testing. Routine prenatal testing includes screening for HIV, unless you choose not to have this test. Follow these instructions at home: Medicines  Follow your health care provider's instructions regarding medicine use. Specific medicines may be  either safe or unsafe to take during pregnancy.  Take a prenatal vitamin that contains at least 600 micrograms (mcg) of folic acid.  If you develop constipation, try taking a stool softener if your health care provider approves. Eating and drinking   Eat a balanced diet that includes fresh fruits and vegetables, whole grains, good sources of protein such as meat, eggs, or tofu, and low-fat dairy. Your health care provider will help you determine the amount of weight gain that is right for you.  Avoid raw meat and uncooked cheese. These carry germs that can cause birth defects in the baby.  If you have low calcium intake from food, talk to your health care provider about whether you should take a daily calcium supplement.  Limit foods that are high in fat and processed sugars, such as fried and sweet foods.  To prevent constipation: ? Drink enough fluid to keep your urine clear or pale yellow. ? Eat foods that are high in fiber, such as fresh fruits and vegetables, whole grains, and beans. Activity  Exercise only as directed by your health care provider. Most women can continue their usual exercise routine during pregnancy. Try to exercise for 30 minutes at least 5 days a week. Stop exercising if you experience uterine contractions.  Avoid heavy lifting, wear low heel shoes, and practice good posture.  A sexual relationship may be continued unless your health care provider directs you otherwise. Relieving pain and discomfort  Wear a good support bra to prevent discomfort from breast tenderness.  Take warm sitz baths to soothe any pain or discomfort caused by hemorrhoids. Use hemorrhoid cream if your health care provider approves.  Rest with your legs elevated if you have leg cramps or low back pain.  If you develop varicose veins, wear support hose. Elevate your feet for 15 minutes, 3-4 times a day. Limit salt in your diet. Prenatal Care  Write down your questions. Take them to  your prenatal visits.  Keep all your prenatal visits as told by your health care provider. This is important. Safety  Wear your seat belt at all times when driving.  Make a list of emergency phone numbers, including numbers for family, friends, the hospital, and police and fire departments. General instructions  Ask your health care provider for a referral to a local prenatal education class. Begin classes no later than the beginning of month 6 of your pregnancy.  Ask for help if you have counseling or nutritional needs during pregnancy. Your health care provider can offer advice or refer you to specialists for help with various needs.  Do not use hot tubs, steam rooms, or saunas.  Do not douche or use tampons or scented sanitary pads.  Do not cross your legs for long periods of time.  Avoid cat litter boxes and soil used by cats. These carry germs that can cause birth defects in the baby and possibly loss of the fetus by miscarriage or stillbirth.  Avoid all smoking, herbs, alcohol, and unprescribed drugs. Chemicals in these products can affect the formation  formation and growth of the baby.  Do not use any products that contain nicotine or tobacco, such as cigarettes and e-cigarettes. If you need help quitting, ask your health care provider.  Visit your dentist if you have not gone yet during your pregnancy. Use a soft toothbrush to brush your teeth and be gentle when you floss. Contact a health care provider if:  You have dizziness.  You have mild pelvic cramps, pelvic pressure, or nagging pain in the abdominal area.  You have persistent nausea, vomiting, or diarrhea.  You have a bad smelling vaginal discharge.  You have pain when you urinate. Get help right away if:  You have a fever.  You are leaking fluid from your vagina.  You have spotting or bleeding from your vagina.  You have severe abdominal cramping or pain.  You have rapid weight gain or weight loss.  You  have shortness of breath with chest pain.  You notice sudden or extreme swelling of your face, hands, ankles, feet, or legs.  You have not felt your baby move in over an hour.  You have severe headaches that do not go away when you take medicine.  You have vision changes. Summary  The second trimester is from week 14 through week 27 (months 4 through 6). It is also a time when the fetus is growing rapidly.  Your body goes through many changes during pregnancy. The changes vary from woman to woman.  Avoid all smoking, herbs, alcohol, and unprescribed drugs. These chemicals affect the formation and growth your baby.  Do not use any tobacco products, such as cigarettes, chewing tobacco, and e-cigarettes. If you need help quitting, ask your health care provider.  Contact your health care provider if you have any questions. Keep all prenatal visits as told by your health care provider. This is important. This information is not intended to replace advice given to you by your health care provider. Make sure you discuss any questions you have with your health care provider. Document Revised: 06/27/2018 Document Reviewed: 04/10/2016 [employer] Patient Education  2020 [employer]   Breastfeeding  Choosing to breastfeed is one of the best decisions you can make for yourself and your baby. A change in hormones during pregnancy causes your breasts to make breast milk in your milk-producing glands. Hormones prevent breast milk from being released before your baby is born. They also prompt milk flow after birth. Once breastfeeding has begun, thoughts of your baby, as well as his or her sucking or crying, can stimulate the release of milk from your milk-producing glands. Benefits of breastfeeding Research shows that breastfeeding offers many health benefits for infants and mothers. It also offers a cost-free and convenient way to feed your baby. For your baby  Your first milk (colostrum) helps your  baby's digestive system to function better.  Special cells in your milk (antibodies) help your baby to fight off infections.  Breastfed babies are less likely to develop asthma, allergies, obesity, or type 2 diabetes. They are also at lower risk for sudden infant death syndrome (SIDS).  Nutrients in breast milk are better able to meet your baby's needs compared to infant formula.  Breast milk improves your baby's brain development. For you  Breastfeeding helps to create a very special bond between you and your baby.  Breastfeeding is convenient. Breast milk costs nothing and is always available at the correct temperature.  Breastfeeding helps to burn calories. It helps you to lose the weight that you   gained during pregnancy.  Breastfeeding makes your uterus return faster to its size before pregnancy. It also slows bleeding (lochia) after you give birth.  Breastfeeding helps to lower your risk of developing type 2 diabetes, osteoporosis, rheumatoid arthritis, cardiovascular disease, and breast, ovarian, uterine, and endometrial cancer later in life. Breastfeeding basics Starting breastfeeding  Find a comfortable place to sit or lie down, with your neck and back well-supported.  Place a pillow or a rolled-up blanket under your baby to bring him or her to the level of your breast (if you are seated). Nursing pillows are specially designed to help support your arms and your baby while you breastfeed.  Make sure that your baby's tummy (abdomen) is facing your abdomen.  Gently massage your breast. With your fingertips, massage from the outer edges of your breast inward toward the nipple. This encourages milk flow. If your milk flows slowly, you may need to continue this action during the feeding.  Support your breast with 4 fingers underneath and your thumb above your nipple (make the letter "C" with your hand). Make sure your fingers are well away from your nipple and your baby's  mouth.  Stroke your baby's lips gently with your finger or nipple.  When your baby's mouth is open wide enough, quickly bring your baby to your breast, placing your entire nipple and as much of the areola as possible into your baby's mouth. The areola is the colored area around your nipple. ? More areola should be visible above your baby's upper lip than below the lower lip. ? Your baby's lips should be opened and extended outward (flanged) to ensure an adequate, comfortable latch. ? Your baby's tongue should be between his or her lower gum and your breast.  Make sure that your baby's mouth is correctly positioned around your nipple (latched). Your baby's lips should create a seal on your breast and be turned out (everted).  It is common for your baby to suck about 2-3 minutes in order to start the flow of breast milk. Latching Teaching your baby how to latch onto your breast properly is very important. An improper latch can cause nipple pain, decreased milk supply, and poor weight gain in your baby. Also, if your baby is not latched onto your nipple properly, he or she may swallow some air during feeding. This can make your baby fussy. Burping your baby when you switch breasts during the feeding can help to get rid of the air. However, teaching your baby to latch on properly is still the best way to prevent fussiness from swallowing air while breastfeeding. Signs that your baby has successfully latched onto your nipple  Silent tugging or silent sucking, without causing you pain. Infant's lips should be extended outward (flanged).  Swallowing heard between every 3-4 sucks once your milk has started to flow (after your let-down milk reflex occurs).  Muscle movement above and in front of his or her ears while sucking. Signs that your baby has not successfully latched onto your nipple  Sucking sounds or smacking sounds from your baby while breastfeeding.  Nipple pain. If you think your baby  has not latched on correctly, slip your finger into the corner of your baby's mouth to break the suction and place it between your baby's gums. Attempt to start breastfeeding again. Signs of successful breastfeeding Signs from your baby  Your baby will gradually decrease the number of sucks or will completely stop sucking.  Your baby will fall asleep.  Your   baby's body will relax.  Your baby will retain a small amount of milk in his or her mouth.  Your baby will let go of your breast by himself or herself. Signs from you  Breasts that have increased in firmness, weight, and size 1-3 hours after feeding.  Breasts that are softer immediately after breastfeeding.  Increased milk volume, as well as a change in milk consistency and color by the fifth day of breastfeeding.  Nipples that are not sore, cracked, or bleeding. Signs that your baby is getting enough milk  Wetting at least 1-2 diapers during the first 24 hours after birth.  Wetting at least 5-6 diapers every 24 hours for the first week after birth. The urine should be clear or pale yellow by the age of 5 days.  Wetting 6-8 diapers every 24 hours as your baby continues to grow and develop.  At least 3 stools in a 24-hour period by the age of 5 days. The stool should be soft and yellow.  At least 3 stools in a 24-hour period by the age of 7 days. The stool should be seedy and yellow.  No loss of weight greater than 10% of birth weight during the first 3 days of life.  Average weight gain of 4-7 oz (113-198 g) per week after the age of 4 days.  Consistent daily weight gain by the age of 5 days, without weight loss after the age of 2 weeks. After a feeding, your baby may spit up a small amount of milk. This is normal. Breastfeeding frequency and duration Frequent feeding will help you make more milk and can prevent sore nipples and extremely full breasts (breast engorgement). Breastfeed when you feel the need to reduce the  fullness of your breasts or when your baby shows signs of hunger. This is called "breastfeeding on demand." Signs that your baby is hungry include:  Increased alertness, activity, or restlessness.  Movement of the head from side to side.  Opening of the mouth when the corner of the mouth or cheek is stroked (rooting).  Increased sucking sounds, smacking lips, cooing, sighing, or squeaking.  Hand-to-mouth movements and sucking on fingers or hands.  Fussing or crying. Avoid introducing a pacifier to your baby in the first 4-6 weeks after your baby is born. After this time, you may choose to use a pacifier. Research has shown that pacifier use during the first year of a baby's life decreases the risk of sudden infant death syndrome (SIDS). Allow your baby to feed on each breast as long as he or she wants. When your baby unlatches or falls asleep while feeding from the first breast, offer the second breast. Because newborns are often sleepy in the first few weeks of life, you may need to awaken your baby to get him or her to feed. Breastfeeding times will vary from baby to baby. However, the following rules can serve as a guide to help you make sure that your baby is properly fed:  Newborns (babies 4 weeks of age or younger) may breastfeed every 1-3 hours.  Newborns should not go without breastfeeding for longer than 3 hours during the day or 5 hours during the night.  You should breastfeed your baby a minimum of 8 times in a 24-hour period. Breast milk pumping     Pumping and storing breast milk allows you to make sure that your baby is exclusively fed your breast milk, even at times when you are unable to breastfeed. This   is especially important if you go back to work while you are still breastfeeding, or if you are not able to be present during feedings. Your lactation consultant can help you find a method of pumping that works best for you and give you guidelines about how long it is safe  to store breast milk. Caring for your breasts while you breastfeed Nipples can become dry, cracked, and sore while breastfeeding. The following recommendations can help keep your breasts moisturized and healthy:  Avoid using soap on your nipples.  Wear a supportive bra designed especially for nursing. Avoid wearing underwire-style bras or extremely tight bras (sports bras).  Air-dry your nipples for 3-4 minutes after each feeding.  Use only cotton bra pads to absorb leaked breast milk. Leaking of breast milk between feedings is normal.  Use lanolin on your nipples after breastfeeding. Lanolin helps to maintain your skin's normal moisture barrier. Pure lanolin is not harmful (not toxic) to your baby. You may also hand express a few drops of breast milk and gently massage that milk into your nipples and allow the milk to air-dry. In the first few weeks after giving birth, some women experience breast engorgement. Engorgement can make your breasts feel heavy, warm, and tender to the touch. Engorgement peaks within 3-5 days after you give birth. The following recommendations can help to ease engorgement:  Completely empty your breasts while breastfeeding or pumping. You may want to start by applying warm, moist heat (in the shower or with warm, water-soaked hand towels) just before feeding or pumping. This increases circulation and helps the milk flow. If your baby does not completely empty your breasts while breastfeeding, pump any extra milk after he or she is finished.  Apply ice packs to your breasts immediately after breastfeeding or pumping, unless this is too uncomfortable for you. To do this: ? Put ice in a plastic bag. ? Place a towel between your skin and the bag. ? Leave the ice on for 20 minutes, 2-3 times a day.  Make sure that your baby is latched on and positioned properly while breastfeeding. If engorgement persists after 48 hours of following these recommendations, contact your  health care provider or a lactation consultant. Overall health care recommendations while breastfeeding  Eat 3 healthy meals and 3 snacks every day. Well-nourished mothers who are breastfeeding need an additional 450-500 calories a day. You can meet this requirement by increasing the amount of a balanced diet that you eat.  Drink enough water to keep your urine pale yellow or clear.  Rest often, relax, and continue to take your prenatal vitamins to prevent fatigue, stress, and low vitamin and mineral levels in your body (nutrient deficiencies).  Do not use any products that contain nicotine or tobacco, such as cigarettes and e-cigarettes. Your baby may be harmed by chemicals from cigarettes that pass into breast milk and exposure to secondhand smoke. If you need help quitting, ask your health care provider.  Avoid alcohol.  Do not use illegal drugs or marijuana.  Talk with your health care provider before taking any medicines. These include over-the-counter and prescription medicines as well as vitamins and herbal supplements. Some medicines that may be harmful to your baby can pass through breast milk.  It is possible to become pregnant while breastfeeding. If birth control is desired, ask your health care provider about options that will be safe while breastfeeding your baby. Where to find more information: [employer]: www.llli.org Contact a health care provider   if:  You feel like you want to stop breastfeeding or have become frustrated with breastfeeding.  Your nipples are cracked or bleeding.  Your breasts are red, tender, or warm.  You have: ? Painful breasts or nipples. ? A swollen area on either breast. ? A fever or chills. ? Nausea or vomiting. ? Drainage other than breast milk from your nipples.  Your breasts do not become full before feedings by the fifth day after you give birth.  You feel sad and depressed.  Your baby is: ? Too sleepy to eat  well. ? Having trouble sleeping. ? More than 1 week old and wetting fewer than 6 diapers in a 24-hour period. ? Not gaining weight by 5 days of age.  Your baby has fewer than 3 stools in a 24-hour period.  Your baby's skin or the white parts of his or her eyes become yellow. Get help right away if:  Your baby is overly tired (lethargic) and does not want to wake up and feed.  Your baby develops an unexplained fever. Summary  Breastfeeding offers many health benefits for infant and mothers.  Try to breastfeed your infant when he or she shows early signs of hunger.  Gently tickle or stroke your baby's lips with your finger or nipple to allow the baby to open his or her mouth. Bring the baby to your breast. Make sure that much of the areola is in your baby's mouth. Offer one side and burp the baby before you offer the other side.  Talk with your health care provider or lactation consultant if you have questions or you face problems as you breastfeed. This information is not intended to replace advice given to you by your health care provider. Make sure you discuss any questions you have with your health care provider. Document Revised: 05/30/2017 Document Reviewed: 04/06/2016 Elsevier Patient Education  2020 [employer]  

## 2020-03-16 NOTE — Progress Notes (Signed)
ROB: Patient notes some Braxton Hicks contractions.  S/p normal anatomy scan. Discussed birth plan, has plans for natural labor. Desires to breastfeed. RTC in 4 weeks.    The following were addressed during this visit:  Breastfeeding Education - Nonpharmacological pain relief methods for labor    Comments: Deep breathing, focusing on pleasant things, movement and walking, heating pads or cold compress, massage and relaxation, continuous support from someone you trust, and Doulas   - Individualized Education    Comments: Contraindications to breastfeeding and other special medical conditions

## 2020-03-19 NOTE — L&D Delivery Note (Signed)
       Delivery Note   Kaitlyn Richmond is a 25 y.o. [id number] at [redacted]w[redacted]d Estimated Date of Delivery: 08/03/20  PRE-OPERATIVE DIAGNOSIS:  1) [redacted]w[redacted]d pregnancy.  2) Active Labor  POST-OPERATIVE DIAGNOSIS:  1) [redacted]w[redacted]d pregnancy s/p Vaginal, Spontaneous  2) Same  Delivery Type: Vaginal, Spontaneous    Delivery Anesthesia: None   Labor Complications:      ESTIMATED BLOOD LOSS: 100  ml    FINDINGS:   1) female infant, Apgar scores of 8   at 1 minute and 9   at 5 minutes and a birthweight of   ounces.    2) Nuchal cord: No  SPECIMENS:   PLACENTA:   Appearance: Intact    Removal: Spontaneous      Disposition:    DISPOSITION:  Infant to left in stable condition in the delivery room, with L&D personnel and mother,  NARRATIVE SUMMARY: Labor course:  Ms. Kaitlyn Richmond is a [id number] at [redacted]w[redacted]d who presented for labor management.  She progressed well in labor without pitocin.  She received the appropriate anesthesia and proceeded to complete dilation. She evidenced good maternal expulsive effort during the second stage. She went on to deliver a viable infant. The placenta delivered without problems and was noted to be complete. A perineal and vaginal examination was performed. Episiotomy/Lacerations: 2nd degree  Episiotomy or lacerations were repaired with Vicryl suture using local anesthesia. The patient tolerated this well.  Elonda Husky, M.D. 07/28/2020 1:16 PM

## 2020-04-13 ENCOUNTER — Encounter: Payer: Self-pay | Admitting: Obstetrics and Gynecology

## 2020-04-13 ENCOUNTER — Ambulatory Visit (INDEPENDENT_AMBULATORY_CARE_PROVIDER_SITE_OTHER): Payer: Medicaid Other | Admitting: Obstetrics and Gynecology

## 2020-04-13 ENCOUNTER — Other Ambulatory Visit: Payer: Self-pay

## 2020-04-13 VITALS — BP 115/74 | HR 105 | Wt 146.4 lb

## 2020-04-13 DIAGNOSIS — Z3A24 24 weeks gestation of pregnancy: Secondary | ICD-10-CM

## 2020-04-13 DIAGNOSIS — Z3482 Encounter for supervision of other normal pregnancy, second trimester: Secondary | ICD-10-CM

## 2020-04-13 NOTE — Progress Notes (Signed)
ROB: Patient describes some mild sciatic nerve pain especially her right leg.  We discussed stretching exercises.  Discussed prenatal vitamins and patient is allergic to most forms of prenatal vitamins.  She is going to begin taking 2 Flintstones vitamins daily because she knows she can take those.  Needs 1 hour GCT and tox screen at next visit.

## 2020-05-13 ENCOUNTER — Other Ambulatory Visit: Payer: Self-pay

## 2020-05-13 ENCOUNTER — Encounter: Payer: Medicaid Other | Admitting: Obstetrics and Gynecology

## 2020-05-13 ENCOUNTER — Other Ambulatory Visit: Payer: Medicaid Other

## 2020-05-13 DIAGNOSIS — Z3403 Encounter for supervision of normal first pregnancy, third trimester: Secondary | ICD-10-CM

## 2020-05-17 ENCOUNTER — Other Ambulatory Visit: Payer: Medicaid Other

## 2020-05-17 ENCOUNTER — Other Ambulatory Visit: Payer: Self-pay

## 2020-05-17 ENCOUNTER — Ambulatory Visit (INDEPENDENT_AMBULATORY_CARE_PROVIDER_SITE_OTHER): Payer: Medicaid Other | Admitting: Obstetrics and Gynecology

## 2020-05-17 ENCOUNTER — Encounter: Payer: Self-pay | Admitting: Obstetrics and Gynecology

## 2020-05-17 VITALS — BP 102/68 | HR 99 | Ht 62.0 in | Wt 156.5 lb

## 2020-05-17 DIAGNOSIS — Z3A28 28 weeks gestation of pregnancy: Secondary | ICD-10-CM

## 2020-05-17 DIAGNOSIS — Z23 Encounter for immunization: Secondary | ICD-10-CM

## 2020-05-17 DIAGNOSIS — Z3483 Encounter for supervision of other normal pregnancy, third trimester: Secondary | ICD-10-CM | POA: Diagnosis not present

## 2020-05-17 DIAGNOSIS — Z0283 Encounter for blood-alcohol and blood-drug test: Secondary | ICD-10-CM

## 2020-05-17 LAB — POCT URINALYSIS DIPSTICK OB
Bilirubin, UA: NEGATIVE
Blood, UA: NEGATIVE
Glucose, UA: NEGATIVE
Ketones, UA: NEGATIVE
Nitrite, UA: NEGATIVE
POC,PROTEIN,UA: NEGATIVE
Spec Grav, UA: 1.025 (ref 1.010–1.025)
Urobilinogen, UA: 0.2 E.U./dL
pH, UA: 6 (ref 5.0–8.0)

## 2020-05-17 MED ORDER — TETANUS-DIPHTH-ACELL PERTUSSIS 5-2.5-18.5 LF-MCG/0.5 IM SUSY
0.5000 mL | PREFILLED_SYRINGE | Freq: Once | INTRAMUSCULAR | Status: AC
  Administered 2020-05-17: 0.5 mL via INTRAMUSCULAR

## 2020-05-17 NOTE — Progress Notes (Signed)
ROB: Still notes sciatic pain, but has improved since she is no longer working. Unable to do glucola today as she had donuts and sweet tea this morning. Will do all labs next visit. Desires to breastfeed, desires no method for contraception. For Tdap today, signed blood consent, discussed cord blood banking. Discussed pain management in labor, desires to not have epidural. RTC in 2 weeks.

## 2020-05-17 NOTE — Progress Notes (Signed)
OB-pt present for rotuine preantal care. Pt stated that she was dong well. BTC, UDS, and tdap completed.

## 2020-05-17 NOTE — Patient Instructions (Signed)

## 2020-05-18 LAB — DRUG PROFILE, UR, 9 DRUGS (LABCORP)
Amphetamines, Urine: NEGATIVE ng/mL
Barbiturate Quant, Ur: NEGATIVE ng/mL
Benzodiazepine Quant, Ur: NEGATIVE ng/mL
Cannabinoid Quant, Ur: NEGATIVE ng/mL
Cocaine (Metab.): NEGATIVE ng/mL
Methadone Screen, Urine: NEGATIVE ng/mL
Opiate Quant, Ur: NEGATIVE ng/mL
PCP Quant, Ur: NEGATIVE ng/mL
Propoxyphene: NEGATIVE ng/mL

## 2020-05-31 ENCOUNTER — Encounter: Payer: Self-pay | Admitting: Obstetrics and Gynecology

## 2020-05-31 ENCOUNTER — Other Ambulatory Visit: Payer: Medicaid Other

## 2020-05-31 ENCOUNTER — Other Ambulatory Visit: Payer: Self-pay

## 2020-05-31 ENCOUNTER — Ambulatory Visit (INDEPENDENT_AMBULATORY_CARE_PROVIDER_SITE_OTHER): Payer: Medicaid Other | Admitting: Obstetrics and Gynecology

## 2020-05-31 VITALS — BP 106/72 | HR 91 | Wt 158.9 lb

## 2020-05-31 DIAGNOSIS — Z3A30 30 weeks gestation of pregnancy: Secondary | ICD-10-CM

## 2020-05-31 DIAGNOSIS — Z3483 Encounter for supervision of other normal pregnancy, third trimester: Secondary | ICD-10-CM

## 2020-05-31 LAB — POCT URINALYSIS DIPSTICK OB
Bilirubin, UA: NEGATIVE
Blood, UA: NEGATIVE
Glucose, UA: NEGATIVE
Ketones, UA: NEGATIVE
Leukocytes, UA: NEGATIVE
Nitrite, UA: NEGATIVE
POC,PROTEIN,UA: NEGATIVE
Spec Grav, UA: 1.015 (ref 1.010–1.025)
Urobilinogen, UA: 0.2 E.U./dL
pH, UA: 6.5 (ref 5.0–8.0)

## 2020-05-31 NOTE — Progress Notes (Signed)
ROB: No complaints.  Doing her 1 hour GCT today.

## 2020-06-01 LAB — CBC
Hematocrit: 34 % (ref 34.0–46.6)
Hemoglobin: 11.3 g/dL (ref 11.1–15.9)
MCH: 28.8 pg (ref 26.6–33.0)
MCHC: 33.2 g/dL (ref 31.5–35.7)
MCV: 87 fL (ref 79–97)
Platelets: 274 10*3/uL (ref 150–450)
RBC: 3.93 x10E6/uL (ref 3.77–5.28)
RDW: 11.8 % (ref 11.7–15.4)
WBC: 7.8 10*3/uL (ref 3.4–10.8)

## 2020-06-01 LAB — RPR: RPR Ser Ql: NONREACTIVE

## 2020-06-01 LAB — GLUCOSE, 1 HOUR GESTATIONAL: Gestational Diabetes Screen: 156 mg/dL — ABNORMAL HIGH (ref 65–139)

## 2020-06-03 ENCOUNTER — Other Ambulatory Visit: Payer: Self-pay | Admitting: Surgical

## 2020-06-03 DIAGNOSIS — Z3483 Encounter for supervision of other normal pregnancy, third trimester: Secondary | ICD-10-CM

## 2020-06-03 DIAGNOSIS — R7309 Other abnormal glucose: Secondary | ICD-10-CM

## 2020-06-06 ENCOUNTER — Other Ambulatory Visit: Payer: Self-pay

## 2020-06-06 ENCOUNTER — Other Ambulatory Visit: Payer: Medicaid Other

## 2020-06-06 DIAGNOSIS — Z3483 Encounter for supervision of other normal pregnancy, third trimester: Secondary | ICD-10-CM

## 2020-06-06 DIAGNOSIS — R7309 Other abnormal glucose: Secondary | ICD-10-CM

## 2020-06-07 ENCOUNTER — Telehealth: Payer: Self-pay | Admitting: Surgical

## 2020-06-07 LAB — GESTATIONAL GLUCOSE TOLERANCE
Glucose, Fasting: 87 mg/dL (ref 65–94)
Glucose, GTT - 1 Hour: 106 mg/dL (ref 65–179)
Glucose, GTT - 2 Hour: 108 mg/dL (ref 65–154)
Glucose, GTT - 3 Hour: 117 mg/dL (ref 65–139)

## 2020-06-07 NOTE — Telephone Encounter (Signed)
Patient called in today with complaints of her wrist hurting and numbness. I left her know that carpel tunnel sometimes happen with pregnant women. I let her know she can take Tylenol to help with the pain and she can get wrist splints at the drug store to wear. Patient verbalized understanding.   Patient ask Kaitlyn Richmond about the same thing yesterday during her 3 hour glucose test.  Kaitlyn Richmond spoke with Dr. Logan Bores about this and told her the same thing that carpel tunnel is normal in pregnant women.

## 2020-06-20 NOTE — Patient Instructions (Addendum)
WHAT OB PATIENTS CAN EXPECT   Confirmation of pregnancy and ultrasound ordered if medically indicated-[redacted] weeks gestation  New OB (NOB) intake with nurse and New OB (NOB) labs- [redacted] weeks gestation  New OB (NOB) physical examination with provider- 11/[redacted] weeks gestation  Flu vaccine-[redacted] weeks gestation  Anatomy scan-[redacted] weeks gestation  Glucose tolerance test, blood work to test for anemia, T-dap vaccine-[redacted] weeks gestation  Vaginal swabs/cultures-STD/Group B strep-[redacted] weeks gestation  Appointments every 4 weeks until 28 weeks  Every 2 weeks from 28 weeks until 36 weeks  Weekly visits from 36 weeks until delivery  Common Medications Safe in Pregnancy  Acne:      Constipation:  Benzoyl Peroxide     Colace  Clindamycin      Dulcolax Suppository  Topica Erythromycin     Fibercon  Salicylic Acid      Metamucil         Miralax AVOID:        Senakot   Accutane    Cough:  Retin-A       Cough Drops  Tetracycline      Phenergan w/ Codeine if Rx  Minocycline      Robitussin (Plain & DM)  Antibiotics:     Crabs/Lice:  Ceclor       RID  Cephalosporins    AVOID:  E-Mycins      Kwell  Keflex  Macrobid/Macrodantin   Diarrhea:  Penicillin      Kao-Pectate  Zithromax      Imodium AD         PUSH FLUIDS AVOID:       Cipro     Fever:  Tetracycline      Tylenol (Regular or Extra  Minocycline       Strength)  Levaquin      Extra Strength-Do not          Exceed 8 tabs/24 hrs Caffeine:        <256m/day (equiv. To 1 cup of coffee or  approx. 3 12 oz sodas)         Gas: Cold/Hayfever:       Gas-X  Benadryl      Mylicon  Claritin       Phazyme  **Claritin-D        Chlor-Trimeton    Headaches:  Dimetapp      ASA-Free Excedrin  Drixoral-Non-Drowsy     Cold Compress  Mucinex (Guaifenasin)     Tylenol (Regular or Extra  Sudafed/Sudafed-12 Hour     Strength)  **Sudafed PE Pseudoephedrine   Tylenol Cold & Sinus     Vicks Vapor Rub  Zyrtec  **AVOID if Problems With Blood  Pressure         Heartburn: Avoid lying down for at least 1 hour after meals  Aciphex      Maalox     Rash:  Milk of Magnesia     Benadryl    Mylanta       1% Hydrocortisone Cream  Pepcid  Pepcid Complete   Sleep Aids:  Prevacid      Ambien   Prilosec       Benadryl  Rolaids       Chamomile Tea  Tums (Limit 4/day)     Unisom         Tylenol PM         Warm milk-add vanilla or  Hemorrhoids:       Sugar for taste  Anusol/Anusol H.C.  (RX: Analapram 2.5%)  Sugar Substitutes:  Hydrocortisone OTC     Ok in moderation  Preparation H      Tucks        Vaseline lotion applied to tissue with wiping    Herpes:     Throat:  Acyclovir      Oragel  Famvir  Valtrex     Vaccines:         Flu Shot Leg Cramps:       *Gardasil  Benadryl      Hepatitis A         Hepatitis B Nasal Spray:       Pneumovax  Saline Nasal Spray     Polio Booster         Tetanus Nausea:       Tuberculosis test or PPD  Vitamin B6 25 mg TID   AVOID:    Dramamine      *Gardasil  Emetrol       Live Poliovirus  Ginger Root 250 mg QID    MMR (measles, mumps &  High Complex Carbs @ Bedtime    rebella)  Sea Bands-Accupressure    Varicella (Chickenpox)  Unisom 1/2 tab TID     *No known complications           If received before Pain:         Known pregnancy;   Darvocet       Resume series after  Lortab        Delivery  Percocet    Yeast:   Tramadol      Femstat  Tylenol 3      Gyne-lotrimin  Ultram       Monistat  Vicodin           MISC:         All Sunscreens           Hair Coloring/highlights          Insect Repellant's          (Including DEET)         Mystic Tans Breastfeeding  Choosing to breastfeed is one of the best decisions you can make for yourself and your baby. A change in hormones during pregnancy causes your breasts to make breast milk in your milk-producing glands. Hormones prevent breast milk from being released before your baby is born. They also prompt milk flow after birth. Once  breastfeeding has begun, thoughts of your baby, as well as his or her sucking or crying, can stimulate the release of milk from your milk-producing glands. Benefits of breastfeeding Research shows that breastfeeding offers many health benefits for infants and mothers. It also offers a cost-free and convenient way to feed your baby. For your baby  Your first milk (colostrum) helps your baby's digestive system to function better.  Special cells in your milk (antibodies) help your baby to fight off infections.  Breastfed babies are less likely to develop asthma, allergies, obesity, or type 2 diabetes. They are also at lower risk for sudden infant death syndrome (SIDS).  Nutrients in breast milk are better able to meet your baby's needs compared to infant formula.  Breast milk improves your baby's brain development. For you  Breastfeeding helps to create a very special bond between you and your baby.  Breastfeeding is convenient. Breast milk costs nothing and is always available at the correct temperature.  Breastfeeding helps to burn calories. It helps you to lose the weight that you gained during pregnancy.  Breastfeeding  makes your uterus return faster to its size before pregnancy. It also slows bleeding (lochia) after you give birth.  Breastfeeding helps to lower your risk of developing type 2 diabetes, osteoporosis, rheumatoid arthritis, cardiovascular disease, and breast, ovarian, uterine, and endometrial cancer later in life. Breastfeeding basics Starting breastfeeding  Find a comfortable place to sit or lie down, with your neck and back well-supported.  Place a pillow or a rolled-up blanket under your baby to bring him or her to the level of your breast (if you are seated). Nursing pillows are specially designed to help support your arms and your baby while you breastfeed.  Make sure that your baby's tummy (abdomen) is facing your abdomen.  Gently massage your breast. With your  fingertips, massage from the outer edges of your breast inward toward the nipple. This encourages milk flow. If your milk flows slowly, you may need to continue this action during the feeding.  Support your breast with 4 fingers underneath and your thumb above your nipple (make the letter "C" with your hand). Make sure your fingers are well away from your nipple and your baby's mouth.  Stroke your baby's lips gently with your finger or nipple.  When your baby's mouth is open wide enough, quickly bring your baby to your breast, placing your entire nipple and as much of the areola as possible into your baby's mouth. The areola is the colored area around your nipple. ? More areola should be visible above your baby's upper lip than below the lower lip. ? Your baby's lips should be opened and extended outward (flanged) to ensure an adequate, comfortable latch. ? Your baby's tongue should be between his or her lower gum and your breast.  Make sure that your baby's mouth is correctly positioned around your nipple (latched). Your baby's lips should create a seal on your breast and be turned out (everted).  It is common for your baby to suck about 2-3 minutes in order to start the flow of breast milk. Latching Teaching your baby how to latch onto your breast properly is very important. An improper latch can cause nipple pain, decreased milk supply, and poor weight gain in your baby. Also, if your baby is not latched onto your nipple properly, he or she may swallow some air during feeding. This can make your baby fussy. Burping your baby when you switch breasts during the feeding can help to get rid of the air. However, teaching your baby to latch on properly is still the best way to prevent fussiness from swallowing air while breastfeeding. Signs that your baby has successfully latched onto your nipple  Silent tugging or silent sucking, without causing you pain. Infant's lips should be extended outward  (flanged).  Swallowing heard between every 3-4 sucks once your milk has started to flow (after your let-down milk reflex occurs).  Muscle movement above and in front of his or her ears while sucking. Signs that your baby has not successfully latched onto your nipple  Sucking sounds or smacking sounds from your baby while breastfeeding.  Nipple pain. If you think your baby has not latched on correctly, slip your finger into the corner of your baby's mouth to break the suction and place it between your baby's gums. Attempt to start breastfeeding again. Signs of successful breastfeeding Signs from your baby  Your baby will gradually decrease the number of sucks or will completely stop sucking.  Your baby will fall asleep.  Your baby's body will relax.  Your baby will retain a small amount of milk in his or her mouth.  Your baby will let go of your breast by himself or herself. Signs from you  Breasts that have increased in firmness, weight, and size 1-3 hours after feeding.  Breasts that are softer immediately after breastfeeding.  Increased milk volume, as well as a change in milk consistency and color by the fifth day of breastfeeding.  Nipples that are not sore, cracked, or bleeding. Signs that your baby is getting enough milk  Wetting at least 1-2 diapers during the first 24 hours after birth.  Wetting at least 5-6 diapers every 24 hours for the first week after birth. The urine should be clear or pale yellow by the age of 5 days.  Wetting 6-8 diapers every 24 hours as your baby continues to grow and develop.  At least 3 stools in a 24-hour period by the age of 5 days. The stool should be soft and yellow.  At least 3 stools in a 24-hour period by the age of 7 days. The stool should be seedy and yellow.  No loss of weight greater than 10% of birth weight during the first 3 days of life.  Average weight gain of 4-7 oz (113-198 g) per week after the age of 4  days.  Consistent daily weight gain by the age of 5 days, without weight loss after the age of 2 weeks. After a feeding, your baby may spit up a small amount of milk. This is normal. Breastfeeding frequency and duration Frequent feeding will help you make more milk and can prevent sore nipples and extremely full breasts (breast engorgement). Breastfeed when you feel the need to reduce the fullness of your breasts or when your baby shows signs of hunger. This is called "breastfeeding on demand." Signs that your baby is hungry include:  Increased alertness, activity, or restlessness.  Movement of the head from side to side.  Opening of the mouth when the corner of the mouth or cheek is stroked (rooting).  Increased sucking sounds, smacking lips, cooing, sighing, or squeaking.  Hand-to-mouth movements and sucking on fingers or hands.  Fussing or crying. Avoid introducing a pacifier to your baby in the first 4-6 weeks after your baby is born. After this time, you may choose to use a pacifier. Research has shown that pacifier use during the first year of a baby's life decreases the risk of sudden infant death syndrome (SIDS). Allow your baby to feed on each breast as long as he or she wants. When your baby unlatches or falls asleep while feeding from the first breast, offer the second breast. Because newborns are often sleepy in the first few weeks of life, you may need to awaken your baby to get him or her to feed. Breastfeeding times will vary from baby to baby. However, the following rules can serve as a guide to help you make sure that your baby is properly fed:  Newborns (babies 64 weeks of age or younger) may breastfeed every 1-3 hours.  Newborns should not go without breastfeeding for longer than 3 hours during the day or 5 hours during the night.  You should breastfeed your baby a minimum of 8 times in a 24-hour period. Breast milk pumping Pumping and storing breast milk allows you to  make sure that your baby is exclusively fed your breast milk, even at times when you are unable to breastfeed. This is especially important if you go back to  work while you are still breastfeeding, or if you are not able to be present during feedings. Your lactation consultant can help you find a method of pumping that works best for you and give you guidelines about how long it is safe to store breast milk.      Caring for your breasts while you breastfeed Nipples can become dry, cracked, and sore while breastfeeding. The following recommendations can help keep your breasts moisturized and healthy:  Avoid using soap on your nipples.  Wear a supportive bra designed especially for nursing. Avoid wearing underwire-style bras or extremely tight bras (sports bras).  Air-dry your nipples for 3-4 minutes after each feeding.  Use only cotton bra pads to absorb leaked breast milk. Leaking of breast milk between feedings is normal.  Use lanolin on your nipples after breastfeeding. Lanolin helps to maintain your skin's normal moisture barrier. Pure lanolin is not harmful (not toxic) to your baby. You may also hand express a few drops of breast milk and gently massage that milk into your nipples and allow the milk to air-dry. In the first few weeks after giving birth, some women experience breast engorgement. Engorgement can make your breasts feel heavy, warm, and tender to the touch. Engorgement peaks within 3-5 days after you give birth. The following recommendations can help to ease engorgement:  Completely empty your breasts while breastfeeding or pumping. You may want to start by applying warm, moist heat (in the shower or with warm, water-soaked hand towels) just before feeding or pumping. This increases circulation and helps the milk flow. If your baby does not completely empty your breasts while breastfeeding, pump any extra milk after he or she is finished.  Apply ice packs to your breasts  immediately after breastfeeding or pumping, unless this is too uncomfortable for you. To do this: ? Put ice in a plastic bag. ? Place a towel between your skin and the bag. ? Leave the ice on for 20 minutes, 2-3 times a day.  Make sure that your baby is latched on and positioned properly while breastfeeding. If engorgement persists after 48 hours of following these recommendations, contact your health care provider or a Science writer. Overall health care recommendations while breastfeeding  Eat 3 healthy meals and 3 snacks every day. Well-nourished mothers who are breastfeeding need an additional 450-500 calories a day. You can meet this requirement by increasing the amount of a balanced diet that you eat.  Drink enough water to keep your urine pale yellow or clear.  Rest often, relax, and continue to take your prenatal vitamins to prevent fatigue, stress, and low vitamin and mineral levels in your body (nutrient deficiencies).  Do not use any products that contain nicotine or tobacco, such as cigarettes and e-cigarettes. Your baby may be harmed by chemicals from cigarettes that pass into breast milk and exposure to secondhand smoke. If you need help quitting, ask your health care provider.  Avoid alcohol.  Do not use illegal drugs or marijuana.  Talk with your health care provider before taking any medicines. These include over-the-counter and prescription medicines as well as vitamins and herbal supplements. Some medicines that may be harmful to your baby can pass through breast milk.  It is possible to become pregnant while breastfeeding. If birth control is desired, ask your health care provider about options that will be safe while breastfeeding your baby. Where to find more information: [employer]: www.llli.org Contact a health care provider if:  You feel  like you want to stop breastfeeding or have become frustrated with breastfeeding.  Your nipples are  cracked or bleeding.  Your breasts are red, tender, or warm.  You have: ? Painful breasts or nipples. ? A swollen area on either breast. ? A fever or chills. ? Nausea or vomiting. ? Drainage other than breast milk from your nipples.  Your breasts do not become full before feedings by the fifth day after you give birth.  You feel sad and depressed.  Your baby is: ? Too sleepy to eat well. ? Having trouble sleeping. ? More than 69 week old and wetting fewer than 6 diapers in a 24-hour period. ? Not gaining weight by 61 days of age.  Your baby has fewer than 3 stools in a 24-hour period.  Your baby's skin or the white parts of his or her eyes become yellow. Get help right away if:  Your baby is overly tired (lethargic) and does not want to wake up and feed.  Your baby develops an unexplained fever. Summary  Breastfeeding offers many health benefits for infant and mothers.  Try to breastfeed your infant when he or she shows early signs of hunger.  Gently tickle or stroke your baby's lips with your finger or nipple to allow the baby to open his or her mouth. Bring the baby to your breast. Make sure that much of the areola is in your baby's mouth. Offer one side and burp the baby before you offer the other side.  Talk with your health care provider or lactation consultant if you have questions or you face problems as you breastfeed. This information is not intended to replace advice given to you by your health care provider. Make sure you discuss any questions you have with your health care provider. Document Revised: 05/30/2017 Document Reviewed: 04/06/2016 Elsevier Patient Education  2021 [place] of Pregnancy  The third trimester of pregnancy is from week 28 through week 26. This is also called months 7 through 9. This trimester is when your unborn baby (fetus) is growing very fast. At the end of the ninth month, the unborn baby is about 20 inches long.  It weighs about 6-10 pounds. Body changes during your third trimester Your body continues to go through many changes during this time. The changes vary and generally return to normal after the baby is born. Physical changes  Your weight will continue to increase. You may gain 25-35 pounds (11-16 kg) by the end of the pregnancy. If you are underweight, you may gain 28-40 lb (about 13-18 kg). If you are overweight, you may gain 15-25 lb (about 7-11 kg).  You may start to get stretch marks on your hips, belly (abdomen), and breasts.  Your breasts will continue to grow and may hurt. A yellow fluid (colostrum) may leak from your breasts. This is the first milk you are making for your baby.  You may have changes in your hair.  Your belly button may stick out.  You may have more swelling in your hands, face, or ankles. Health changes  You may have heartburn.  You may have trouble pooping (constipation).  You may get hemorrhoids. These are swollen veins in the butt that can itch or get painful.  You may have swollen veins (varicose veins) in your legs.  You may have more body aches in the pelvis, back, or thighs.  You may have more tingling or numbness in your hands, arms, and legs. The skin  on your belly may also feel numb.  You may feel short of breath as your womb (uterus) gets bigger. Other changes  You may pee (urinate) more often.  You may have more problems sleeping.  You may notice the unborn baby "dropping," or moving lower in your belly.  You may have more discharge coming from your vagina.  Your joints may feel loose, and you may have pain around your pelvic bone. Follow these instructions at home: Medicines  Take over-the-counter and prescription medicines only as told by your doctor. Some medicines are not safe during pregnancy.  Take a prenatal vitamin that contains at least 600 micrograms (mcg) of folic acid. Eating and drinking  Eat healthy meals that  include: ? Fresh fruits and vegetables. ? Whole grains. ? Good sources of protein, such as meat, eggs, or tofu. ? Low-fat dairy products.  Avoid raw meat and unpasteurized juice, milk, and cheese. These carry germs that can harm you and your baby.  Eat 4 or 5 small meals rather than 3 large meals a day.  You may need to take these actions to prevent or treat trouble pooping: ? Drink enough fluids to keep your pee (urine) pale yellow. ? Eat foods that are high in fiber. These include beans, whole grains, and fresh fruits and vegetables. ? Limit foods that are high in fat and sugar. These include fried or sweet foods. Activity  Exercise only as told by your doctor. Stop exercising if you start to have cramps in your womb.  Avoid heavy lifting.  Do not exercise if it is too hot or too humid, or if you are in a place of great height (high altitude).  If you choose to, you may have sex unless your doctor tells you not to. Relieving pain and discomfort  Take breaks often, and rest with your legs raised (elevated) if you have leg cramps or low back pain.  Take warm water baths (sitz baths) to soothe pain or discomfort caused by hemorrhoids. Use hemorrhoid cream if your doctor approves.  Wear a good support bra if your breasts are tender.  If you develop bulging, swollen veins in your legs: ? Wear support hose as told by your doctor. ? Raise your feet for 15 minutes, 3-4 times a day. ? Limit salt in your food. Safety  Talk to your doctor before traveling far distances.  Do not use hot tubs, steam rooms, or saunas.  Wear your seat belt at all times when you are in a car.  Talk with your doctor if someone is hurting you or yelling at you a lot. Preparing for your baby's arrival To prepare for the arrival of your baby:  Take prenatal classes.  Visit the hospital and tour the maternity area.  Buy a rear-facing car seat. Learn how to install it in your car.  Prepare the baby's  room. Take out all pillows and stuffed animals from the baby's crib. General instructions  Avoid cat litter boxes and soil used by cats. These carry germs that can cause harm to the baby and can cause a loss of your baby by miscarriage or stillbirth.  Do not douche or use tampons. Do not use scented sanitary pads.  Do not smoke or use any products that contain nicotine or tobacco. If you need help quitting, ask your doctor.  Do not drink alcohol.  Do not use herbal medicines, illegal drugs, or medicines that were not approved by your doctor. Chemicals in these products can  affect your baby.  Keep all follow-up visits. This is important. Where to find more information  American Pregnancy Association: americanpregnancy.org  [employer]nec[employer]: www.acog.org  Office on Women's Health: KeywordPortfolios.com.br Contact a doctor if:  You have a fever.  You have mild cramps or pressure in your lower belly.  You have a nagging pain in your belly area.  You vomit, or you have watery poop (diarrhea).  You have bad-smelling fluid coming from your vagina.  You have pain when you pee, or your pee smells bad.  You have a headache that does not go away when you take medicine.  You have changes in how you see, or you see spots in front of your eyes. Get help right away if:  Your water breaks.  You have regular contractions that are less than 5 minutes apart.  You are spotting or bleeding from your vagina.  You have very bad belly cramps or pain.  You have trouble breathing.  You have chest pain.  You faint.  You have not felt the baby move for the amount of time told by your doctor.  You have new or increased pain, swelling, or redness in an arm or leg. Summary  The third trimester is from week 28 through week 40 (months 7 through 9). This is the time when your unborn baby is growing very fast.  During this time, your discomfort may  increase as you gain weight and as your baby grows.  Get ready for your baby to arrive by taking prenatal classes, buying a rear-facing car seat, and preparing the baby's room.  Get help right away if you are bleeding from your vagina, you have chest pain and trouble breathing, or you have not felt the baby move for the amount of time told by your doctor. This information is not intended to replace advice given to you by your health care provider. Make sure you discuss any questions you have with your health care provider. Document Revised: 08/12/2019 Document Reviewed: 06/18/2019 Elsevier Patient Education  2021 [employer].

## 2020-06-21 ENCOUNTER — Ambulatory Visit (INDEPENDENT_AMBULATORY_CARE_PROVIDER_SITE_OTHER): Payer: Medicaid Other | Admitting: Obstetrics and Gynecology

## 2020-06-21 ENCOUNTER — Other Ambulatory Visit: Payer: Self-pay

## 2020-06-21 ENCOUNTER — Encounter: Payer: Self-pay | Admitting: Obstetrics and Gynecology

## 2020-06-21 VITALS — BP 112/75 | HR 92 | Wt 162.0 lb

## 2020-06-21 DIAGNOSIS — Z8619 Personal history of other infectious and parasitic diseases: Secondary | ICD-10-CM | POA: Insufficient documentation

## 2020-06-21 DIAGNOSIS — Z3A33 33 weeks gestation of pregnancy: Secondary | ICD-10-CM

## 2020-06-21 DIAGNOSIS — G5600 Carpal tunnel syndrome, unspecified upper limb: Secondary | ICD-10-CM

## 2020-06-21 DIAGNOSIS — Z3483 Encounter for supervision of other normal pregnancy, third trimester: Secondary | ICD-10-CM

## 2020-06-21 DIAGNOSIS — O26899 Other specified pregnancy related conditions, unspecified trimester: Secondary | ICD-10-CM

## 2020-06-21 LAB — POCT URINALYSIS DIPSTICK OB
Bilirubin, UA: NEGATIVE
Blood, UA: NEGATIVE
Glucose, UA: NEGATIVE
Ketones, UA: NEGATIVE
Nitrite, UA: NEGATIVE
POC,PROTEIN,UA: NEGATIVE
Spec Grav, UA: 1.015 (ref 1.010–1.025)
Urobilinogen, UA: 0.2 E.U./dL
pH, UA: 6.5 (ref 5.0–8.0)

## 2020-06-21 NOTE — Progress Notes (Signed)
OB-Pt present for routine prenatal care. Pt stated having vaginal pressure and braxton hick contractions.  

## 2020-06-21 NOTE — Progress Notes (Signed)
ROB: Patient doing well, just tired, didn't get much sleep last night. Notes tightness in hands, advised to increase fluid intake. Noting some carpal tunnel symptoms. Advised on hand braces. RTC in 2 weeks.

## 2020-07-05 ENCOUNTER — Encounter: Payer: Self-pay | Admitting: Obstetrics and Gynecology

## 2020-07-05 ENCOUNTER — Other Ambulatory Visit: Payer: Self-pay

## 2020-07-05 ENCOUNTER — Ambulatory Visit (INDEPENDENT_AMBULATORY_CARE_PROVIDER_SITE_OTHER): Payer: Medicaid Other | Admitting: Obstetrics and Gynecology

## 2020-07-05 VITALS — BP 111/75 | HR 88 | Wt 166.7 lb

## 2020-07-05 DIAGNOSIS — Z3483 Encounter for supervision of other normal pregnancy, third trimester: Secondary | ICD-10-CM

## 2020-07-05 DIAGNOSIS — Z3A35 35 weeks gestation of pregnancy: Secondary | ICD-10-CM

## 2020-07-05 LAB — POCT URINALYSIS DIPSTICK OB
Bilirubin, UA: NEGATIVE
Blood, UA: NEGATIVE
Glucose, UA: NEGATIVE
Ketones, UA: NEGATIVE
Leukocytes, UA: NEGATIVE
Nitrite, UA: NEGATIVE
Spec Grav, UA: 1.01 (ref 1.010–1.025)
Urobilinogen, UA: 0.2 E.U./dL
pH, UA: 6.5 (ref 5.0–8.0)

## 2020-07-05 NOTE — Progress Notes (Signed)
ROB: No complaints.  GBS-GC/CT performed.

## 2020-07-07 LAB — GC/CHLAMYDIA PROBE AMP
Chlamydia trachomatis, NAA: NEGATIVE
Neisseria Gonorrhoeae by PCR: NEGATIVE

## 2020-07-07 LAB — STREP GP B NAA+RFLX: Strep Gp B NAA+Rflx: NEGATIVE

## 2020-07-12 ENCOUNTER — Telehealth: Payer: Self-pay | Admitting: Obstetrics and Gynecology

## 2020-07-12 NOTE — Telephone Encounter (Signed)
Pt. called stating that the numbness and swelling in her hands is worsening, she said it have been present for months and dx previously as carpal tunnel- pt would like to speak with a nurse. Please Advise.

## 2020-07-13 NOTE — Telephone Encounter (Signed)
Tried to call patient. Mail box is full

## 2020-07-15 ENCOUNTER — Ambulatory Visit (INDEPENDENT_AMBULATORY_CARE_PROVIDER_SITE_OTHER): Payer: Medicaid Other | Admitting: Obstetrics and Gynecology

## 2020-07-15 ENCOUNTER — Other Ambulatory Visit: Payer: Self-pay

## 2020-07-15 ENCOUNTER — Encounter: Payer: Self-pay | Admitting: Obstetrics and Gynecology

## 2020-07-15 VITALS — BP 123/86 | HR 89 | Wt 168.9 lb

## 2020-07-15 DIAGNOSIS — O26899 Other specified pregnancy related conditions, unspecified trimester: Secondary | ICD-10-CM

## 2020-07-15 DIAGNOSIS — Z3A37 37 weeks gestation of pregnancy: Secondary | ICD-10-CM

## 2020-07-15 DIAGNOSIS — G5600 Carpal tunnel syndrome, unspecified upper limb: Secondary | ICD-10-CM

## 2020-07-15 DIAGNOSIS — Z3483 Encounter for supervision of other normal pregnancy, third trimester: Secondary | ICD-10-CM

## 2020-07-15 LAB — POCT URINALYSIS DIPSTICK OB
Bilirubin, UA: NEGATIVE
Blood, UA: NEGATIVE
Glucose, UA: NEGATIVE
Ketones, UA: NEGATIVE
Nitrite, UA: NEGATIVE
POC,PROTEIN,UA: NEGATIVE
Spec Grav, UA: 1.02 (ref 1.010–1.025)
Urobilinogen, UA: 0.2 E.U./dL
pH, UA: 6.5 (ref 5.0–8.0)

## 2020-07-15 NOTE — Progress Notes (Signed)
ROB: Still noting swelling of hands, some numbness and tingling at night. Notes Tuesday hands were swollen to were she was unable to open any bottles or drive due to discomfort.  Advised again on use of hand/wrist braces. RTC in 1 week.

## 2020-07-15 NOTE — Patient Instructions (Signed)

## 2020-07-15 NOTE — Progress Notes (Signed)
OB-pt present for routine prenatal care. Pt stated having vaginal pressure.

## 2020-07-15 NOTE — Progress Notes (Deleted)
ROB: Patient doing

## 2020-07-21 ENCOUNTER — Other Ambulatory Visit: Payer: Self-pay

## 2020-07-21 ENCOUNTER — Encounter: Payer: Self-pay | Admitting: Obstetrics and Gynecology

## 2020-07-21 ENCOUNTER — Ambulatory Visit (INDEPENDENT_AMBULATORY_CARE_PROVIDER_SITE_OTHER): Payer: Medicaid Other | Admitting: Obstetrics and Gynecology

## 2020-07-21 VITALS — BP 120/80 | HR 93 | Wt 170.9 lb

## 2020-07-21 DIAGNOSIS — Z3483 Encounter for supervision of other normal pregnancy, third trimester: Secondary | ICD-10-CM

## 2020-07-21 DIAGNOSIS — Z3A38 38 weeks gestation of pregnancy: Secondary | ICD-10-CM

## 2020-07-21 LAB — POCT URINALYSIS DIPSTICK OB
Bilirubin, UA: NEGATIVE
Blood, UA: NEGATIVE
Glucose, UA: NEGATIVE
Ketones, UA: NEGATIVE
Leukocytes, UA: NEGATIVE
Nitrite, UA: NEGATIVE
POC,PROTEIN,UA: NEGATIVE
Spec Grav, UA: 1.01 (ref 1.010–1.025)
Urobilinogen, UA: 0.2 E.U./dL
pH, UA: 7 (ref 5.0–8.0)

## 2020-07-21 NOTE — Progress Notes (Signed)
ROB: Has not tried wrist splints-says her carpal tunnel is "still the same".  She denies painful contractions.  Reports daily fetal movement.  Signs and symptoms of labor discussed.

## 2020-07-26 ENCOUNTER — Ambulatory Visit (INDEPENDENT_AMBULATORY_CARE_PROVIDER_SITE_OTHER): Payer: Medicaid Other | Admitting: Obstetrics and Gynecology

## 2020-07-26 ENCOUNTER — Other Ambulatory Visit: Payer: Self-pay

## 2020-07-26 ENCOUNTER — Encounter: Payer: Self-pay | Admitting: Obstetrics and Gynecology

## 2020-07-26 VITALS — BP 120/80 | HR 78 | Wt 171.0 lb

## 2020-07-26 DIAGNOSIS — Z3483 Encounter for supervision of other normal pregnancy, third trimester: Secondary | ICD-10-CM

## 2020-07-26 DIAGNOSIS — Z3A38 38 weeks gestation of pregnancy: Secondary | ICD-10-CM

## 2020-07-26 NOTE — Patient Instructions (Addendum)

## 2020-07-26 NOTE — Progress Notes (Signed)
ROB: Complains of vaginal pressures and Braxton Hicks. Discussed labor precautions. RTC in 1 week.

## 2020-07-26 NOTE — Progress Notes (Signed)
OB-Pt present for routine prenatal care. Pt c/o vaginal pressure and braxton hick contractions.

## 2020-07-28 ENCOUNTER — Inpatient Hospital Stay
Admission: EM | Admit: 2020-07-28 | Discharge: 2020-07-29 | DRG: 807 | Disposition: A | Discharge status: Home / Self Care | Payer: Medicaid Other | Attending: Obstetrics and Gynecology | Admitting: Obstetrics and Gynecology

## 2020-07-28 ENCOUNTER — Encounter: Payer: Self-pay | Admitting: Obstetrics and Gynecology

## 2020-07-28 ENCOUNTER — Other Ambulatory Visit: Payer: Self-pay

## 2020-07-28 DIAGNOSIS — Z20822 Contact with and (suspected) exposure to covid-19: Secondary | ICD-10-CM | POA: Diagnosis present

## 2020-07-28 DIAGNOSIS — O26893 Other specified pregnancy related conditions, third trimester: Secondary | ICD-10-CM | POA: Diagnosis present

## 2020-07-28 DIAGNOSIS — Z3A39 39 weeks gestation of pregnancy: Secondary | ICD-10-CM | POA: Diagnosis not present

## 2020-07-28 LAB — TYPE AND SCREEN
ABO/RH(D): B POS
Antibody Screen: NEGATIVE

## 2020-07-28 LAB — CBC
HCT: 33.3 % — ABNORMAL LOW (ref 36.0–46.0)
Hemoglobin: 10.8 g/dL — ABNORMAL LOW (ref 12.0–15.0)
MCH: 26.4 pg (ref 26.0–34.0)
MCHC: 32.4 g/dL (ref 30.0–36.0)
MCV: 81.4 fL (ref 80.0–100.0)
Platelets: 304 10*3/uL (ref 150–400)
RBC: 4.09 MIL/uL (ref 3.87–5.11)
RDW: 13.5 % (ref 11.5–15.5)
WBC: 10.5 10*3/uL (ref 4.0–10.5)
nRBC: 0 % (ref 0.0–0.2)

## 2020-07-28 LAB — URINALYSIS, ROUTINE W REFLEX MICROSCOPIC
Bacteria, UA: NONE SEEN
Bilirubin Urine: NEGATIVE
Glucose, UA: NEGATIVE mg/dL
Ketones, ur: NEGATIVE mg/dL
Nitrite: NEGATIVE
Protein, ur: NEGATIVE mg/dL
Specific Gravity, Urine: 1.005 (ref 1.005–1.030)
pH: 7 (ref 5.0–8.0)

## 2020-07-28 LAB — RESP PANEL BY RT-PCR (FLU A&B, COVID) ARPGX2
Influenza A by PCR: NEGATIVE
Influenza B by PCR: NEGATIVE
SARS Coronavirus 2 by RT PCR: NEGATIVE

## 2020-07-28 LAB — RUPTURE OF MEMBRANE (ROM)PLUS: Rom Plus: NEGATIVE

## 2020-07-28 MED ORDER — ZOLPIDEM TARTRATE 5 MG PO TABS
5.0000 mg | ORAL_TABLET | Freq: Every evening | ORAL | Status: DC | PRN
Start: 2020-07-28 — End: 2020-07-29

## 2020-07-28 MED ORDER — ACETAMINOPHEN 325 MG PO TABS
650.0000 mg | ORAL_TABLET | ORAL | Status: DC | PRN
  Filled 2020-07-28: qty 2

## 2020-07-28 MED ORDER — OXYTOCIN-SODIUM CHLORIDE 30-0.9 UT/500ML-% IV SOLN
2.5000 [IU]/h | INTRAVENOUS | Status: DC
  Administered 2020-07-28: 2.5 [IU]/h via INTRAVENOUS

## 2020-07-28 MED ORDER — DIPHENHYDRAMINE HCL 25 MG PO CAPS: 25.0000 mg | ORAL_CAPSULE | Freq: Four times a day (QID) | ORAL | Status: DC | PRN

## 2020-07-28 MED ORDER — BUTORPHANOL TARTRATE 1 MG/ML IJ SOLN
1.0000 mg | Freq: Once | INTRAMUSCULAR | Status: AC
  Administered 2020-07-28: 1 mg via INTRAVENOUS

## 2020-07-28 MED ORDER — IBUPROFEN 600 MG PO TABS
600.0000 mg | ORAL_TABLET | Freq: Four times a day (QID) | ORAL | Status: DC
  Administered 2020-07-28 – 2020-07-29 (×3): 600 mg via ORAL
  Filled 2020-07-28 (×3): qty 1

## 2020-07-28 MED ORDER — LIDOCAINE HCL (PF) 1 % IJ SOLN: 30.0000 mL | INTRAMUSCULAR | Status: DC | PRN

## 2020-07-28 MED ORDER — OXYTOCIN BOLUS FROM INFUSION
333.0000 mL | Freq: Once | INTRAVENOUS | Status: AC
  Administered 2020-07-28: 333 mL via INTRAVENOUS

## 2020-07-28 MED ORDER — BUTORPHANOL TARTRATE 1 MG/ML IJ SOLN: 1.0000 mg | INTRAMUSCULAR | Status: DC | PRN

## 2020-07-28 MED ORDER — OXYTOCIN 10 UNIT/ML IJ SOLN
INTRAMUSCULAR | Status: AC
  Filled 2020-07-28: qty 2

## 2020-07-28 MED ORDER — LIDOCAINE HCL (PF) 1 % IJ SOLN
INTRAMUSCULAR | Status: AC
  Administered 2020-07-28: 30 mL via VAGINAL
  Filled 2020-07-28: qty 30

## 2020-07-28 MED ORDER — ACETAMINOPHEN 325 MG PO TABS
650.0000 mg | ORAL_TABLET | ORAL | Status: DC | PRN
  Administered 2020-07-28 – 2020-07-29 (×3): 650 mg via ORAL
  Filled 2020-07-28 (×2): qty 2

## 2020-07-28 MED ORDER — OXYTOCIN-SODIUM CHLORIDE 30-0.9 UT/500ML-% IV SOLN
INTRAVENOUS | Status: AC
  Filled 2020-07-28: qty 1000

## 2020-07-28 MED ORDER — DOCUSATE SODIUM 100 MG PO CAPS
100.0000 mg | ORAL_CAPSULE | Freq: Two times a day (BID) | ORAL | Status: DC
  Administered 2020-07-29: 100 mg via ORAL
  Filled 2020-07-28: qty 1

## 2020-07-28 MED ORDER — OXYCODONE-ACETAMINOPHEN 5-325 MG PO TABS: 1.0000 | ORAL_TABLET | ORAL | Status: DC | PRN

## 2020-07-28 MED ORDER — LACTATED RINGERS IV SOLN: 500.0000 mL | INTRAVENOUS | Status: DC | PRN

## 2020-07-28 MED ORDER — SIMETHICONE 80 MG PO CHEW: 80.0000 mg | CHEWABLE_TABLET | ORAL | Status: DC | PRN

## 2020-07-28 MED ORDER — MISOPROSTOL 200 MCG PO TABS
ORAL_TABLET | ORAL | Status: AC
  Filled 2020-07-28: qty 4

## 2020-07-28 MED ORDER — SOD CITRATE-CITRIC ACID 500-334 MG/5ML PO SOLN: 30.0000 mL | ORAL | Status: DC | PRN

## 2020-07-28 MED ORDER — ONDANSETRON HCL 4 MG/2ML IJ SOLN: 4.0000 mg | Freq: Four times a day (QID) | INTRAMUSCULAR | Status: DC | PRN

## 2020-07-28 MED ORDER — BENZOCAINE-MENTHOL 20-0.5 % EX AERO
1.0000 "application " | INHALATION_SPRAY | CUTANEOUS | Status: DC | PRN
  Administered 2020-07-28: 1 via TOPICAL
  Filled 2020-07-28: qty 56

## 2020-07-28 MED ORDER — AMMONIA AROMATIC IN INHA
RESPIRATORY_TRACT | Status: AC
  Filled 2020-07-28: qty 10

## 2020-07-28 MED ORDER — PRENATAL MULTIVITAMIN CH: 1.0000 | ORAL_TABLET | Freq: Every day | ORAL | Status: DC

## 2020-07-28 MED ORDER — TETANUS-DIPHTH-ACELL PERTUSSIS 5-2.5-18.5 LF-MCG/0.5 IM SUSY: 0.5000 mL | PREFILLED_SYRINGE | Freq: Once | INTRAMUSCULAR | Status: DC

## 2020-07-28 MED ORDER — LACTATED RINGERS IV SOLN: INTRAVENOUS | Status: DC

## 2020-07-28 MED ORDER — OXYTOCIN-SODIUM CHLORIDE 30-0.9 UT/500ML-% IV SOLN: 2.5000 [IU]/h | INTRAVENOUS | Status: DC | PRN

## 2020-07-28 NOTE — Lactation Note (Signed)
This note was copied from a baby's chart. Lactation Consultation Note  Patient Name: Kaitlyn Richmond AJOIN'O Date: 07/28/2020 Reason for consult: Initial assessment;L&D Initial assessment;Mother's request;Term;Other (Comment) (nipple shield) Age:26 hours  Initial lactation visit per mother's request, and requesting nipple shield. Mom is G5P3, SVD <1hour ago.   Baby was down beside mom on bed, mom lying down when LC entered the room. LC discussed with mom the need for nipple shield as mom appears to have well everted nipples. Mom states her preference is to use nipple shield, and she desires to breast and potentially bottle feed.  Size 73mm NS given, mom independently applied it in good position, and brought baby into reclined modified cradle hold. Baby accepted the nipple shield well and easily, but took a few minutes to begin feeding. Strong rhythmic sucking pattern noted, and pointed out to mom.  Mom declined hand expression review/education. LC provided brief guidance on newborn stomach size, feeding patterns and behaviors, early cues, building of milk supply with on demand feedings and skin to skin, and encouraged the delay of the formula introduction. Mom verbalized understanding of the information.   LC to follow-up once on MBU.   Maternal Data Has patient been taught Hand Expression?: No Does the patient have breastfeeding experience prior to this delivery?: Yes How long did the patient breastfeed?: 3 months  Feeding Mother's Current Feeding Choice: Breast Milk and Formula  LATCH Score Latch: Grasps breast easily, tongue down, lips flanged, rhythmical sucking. (with nipple shield)  Audible Swallowing: A few with stimulation  Type of Nipple: Everted at rest and after stimulation  Comfort (Breast/Nipple): Soft / non-tender  Hold (Positioning): Assistance needed to correctly position infant at breast and maintain latch.  LATCH Score: 8   Lactation Tools  Discussed/Used Tools: Nipple Shields Nipple shield size: 20  Interventions Interventions: Breast feeding basics reviewed;Assisted with latch;Hand express;Adjust position;Education (nipple shield/declined hand expression)  Discharge    Consult Status Consult Status: Follow-up Date: 07/28/20 Follow-up type: In-patient    Danford Bad 07/28/2020, 1:53 PM

## 2020-07-28 NOTE — H&P (Signed)
History and Physical   HPI  Kaitlyn Richmond is a 25 y.o. [id number] at [redacted]w[redacted]d Estimated Date of Delivery: 08/03/20 who is being admitted for labor management.   OB History  OB History  Gravida Para Term Preterm AB Living  5 2 2  0 2 2  SAB IAB Ectopic Multiple Live Births  1 1 0 0 2    # Outcome Date GA Lbr Len/2nd Weight Sex Delivery Anes PTL Lv  5 Current           4 Term 10/02/15 [redacted]w[redacted]d  3090 g M Vag-Spont None  LIV     Birth Comments: [place] Spots on buttocks and lower back     Name: Kaitlyn, Richmond     Apgar1: 8  Apgar5: 9  3 Term 06/28/14   2296 g M Vag-Spont EPI  LIV  2 IAB           1 SAB             PROBLEM LIST  Pregnancy complications or risks: Patient Active Problem List   Diagnosis Date Noted  . Normal labor 07/28/2020  . History of chlamydia infection 06/21/2020  . Supervision of normal pregnancy 12/24/2019    Prenatal labs and studies: ABO, Rh: B/Positive/-- (10/07 1515) Antibody: Negative (10/07 1515) Rubella: 1.53 (10/07 1515) RPR: Non Reactive (03/15 1012)  HBsAg: Negative (10/07 1515)  HIV: Non Reactive (10/07 1515)  GBS:--/Negative (04/19 1455)   Past Medical History:  Diagnosis Date  . Hx: UTI (urinary tract infection)      History reviewed. No pertinent surgical history.   Medications    Current Discharge Medication List    CONTINUE these medications which have NOT CHANGED   Details  Pediatric Multiple Vit-C-FA (FLINSTONES GUMMIES OMEGA-3 DHA PO) Take by mouth.         Allergies  Penicillins and Shellfish allergy  Review of Systems  Pertinent items noted in HPI and remainder of comprehensive ROS otherwise negative.  Physical Exam  BP 108/80 (BP Location: Left Arm)   Pulse 100   Temp 98.6 F (37 C) (Oral)   Resp 18   Ht 5\' 2"  (1.575 m)   Wt 78 kg   LMP 10/28/2019 (Exact Date)   BMI 31.46 kg/m   Lungs:  CTA B Cardio: RRR without M/R/G Abd: Soft, gravid, NT Presentation: cephalic EXT: No C/C/ 1+  Edema DTRs: 2+ B CERVIX: Dilation: 5 Effacement (%): 60 Cervical Position: Middle Station: -1 Exam by:: 12/28/2019 RN  See Prenatal records for more detailed PE.     FHR:  Variability: Good {> 6 bpm)  Toco: Uterine Contractions: regular painful  Test Results  Results for orders placed or performed during the hospital encounter of 07/28/20 (from the past 24 hour(s))  ROM Plus ([facility] only)     Status: None   Collection Time: 07/28/20  8:43 AM  Result Value Ref Range   Rom Plus NEGATIVE   Urinalysis, Routine w reflex microscopic     Status: Abnormal   Collection Time: 07/28/20  8:43 AM  Result Value Ref Range   Color, Urine STRAW (A) YELLOW   APPearance CLEAR (A) CLEAR   Specific Gravity, Urine 1.005 1.005 - 1.030   pH 7.0 5.0 - 8.0   Glucose, UA NEGATIVE NEGATIVE mg/dL   Hgb urine dipstick MODERATE (A) NEGATIVE   Bilirubin Urine NEGATIVE NEGATIVE   Ketones, ur NEGATIVE NEGATIVE mg/dL   Protein, ur NEGATIVE NEGATIVE mg/dL   Nitrite  NEGATIVE NEGATIVE   Leukocytes,Ua MODERATE (A) NEGATIVE   WBC, UA 0-5 0 - 5 WBC/hpf   Bacteria, UA NONE SEEN NONE SEEN   Squamous Epithelial / LPF 0-5 0 - 5   Mucus PRESENT      Assessment   [id number] at [redacted]w[redacted]d Estimated Date of Delivery: 08/03/20  The fetus is reassuring.   Patient Active Problem List   Diagnosis Date Noted  . Normal labor 07/28/2020  . History of chlamydia infection 06/21/2020  . Supervision of normal pregnancy 12/24/2019    Plan  1. Admit to L&D :   2. EFM: -- Category 1 3. Stadol or Epidural if desired.   4. Admission labs  5. Expect vaginal delivery  Elonda Husky, M.D. 07/28/2020 11:43 AM

## 2020-07-28 NOTE — OB Triage Note (Signed)
Patient presented to L&D for evaluation after phone call with nurse line/ OB office.  Patient noticed some pink vaginal bleeding and possibly part of her mucous plug when up to the bathroom at 0600 also reports feeling urinary urgency. Reports feeling contractions around 0630, states they are a 5/10 on the pain scale.  Denies leaking of fluid and normal fetal movements.

## 2020-07-29 LAB — RPR: RPR Ser Ql: NONREACTIVE

## 2020-07-29 MED ORDER — IBUPROFEN 600 MG PO TABS
600.0000 mg | ORAL_TABLET | Freq: Four times a day (QID) | ORAL | Status: DC
  Administered 2020-07-29 (×2): 600 mg via ORAL
  Filled 2020-07-29 (×2): qty 1

## 2020-07-29 NOTE — Discharge Summary (Signed)
Patient Name: Kaitlyn Richmond DOB: 01/06/1996 MRN: [id number]                            Discharge Summary  Date of Admission: 07/28/2020 Date of Discharge: 07/29/2020 Delivering Provider: Linzie Collin   Admitting Diagnosis: Normal labor [O80, Z37.9] at [redacted]w[redacted]d Secondary diagnosis:  Active Problems:   Normal labor  Mode of Delivery: normal spontaneous vaginal delivery              Discharge diagnosis: Term Pregnancy Delivered      Intrapartum Procedures:    Post partum procedures:   Complications: none                     Discharge Day SOAP Note:  Progress Note - Vaginal Delivery  Kaitlyn Richmond is a 25 y.o. [id number] now PP day 1 s/p Vaginal, Spontaneous . Delivery was uncomplicated  Subjective  The patient has the following complaints: has no unusual complaints  Pain is controlled with current medications.   Patient is urinating without difficulty.  She is ambulating well.   Desires discharge.e  Objective  Vital signs: BP (!) 129/91 (BP Location: Left Arm)   Pulse 86   Temp 98.3 F (36.8 C) (Oral)   Resp 20   Ht 5\' 2"  (1.575 m)   Wt 78 kg   LMP 10/28/2019 (Exact Date)   SpO2 97%   Breastfeeding Unknown   BMI 31.46 kg/m   Physical Exam: Gen: NAD Fundus Fundal Tone: Firm  Lochia Amount: Small        Data Review Labs: Lab Results  Component Value Date   WBC 10.5 07/28/2020   HGB 10.8 (L) 07/28/2020   HCT 33.3 (L) 07/28/2020   MCV 81.4 07/28/2020   PLT 304 07/28/2020   CBC Latest Ref Rng & Units 07/28/2020 05/31/2020 12/24/2019  WBC 4.0 - 10.5 K/uL 10.5 7.8 5.1  Hemoglobin 12.0 - 15.0 g/dL 10.8(L) 11.3 13.5  Hematocrit 36.0 - 46.0 % 33.3(L) 34.0 39.9  Platelets 150 - 400 K/uL 304 274 259   B POS  Edinburgh Score: Edinburgh Postnatal Depression Scale Screening Tool 07/29/2020  I have been able to laugh and see the funny side of things. 0  I have looked forward with enjoyment to things. 0  I have blamed myself unnecessarily when things  went wrong. 0  I have been anxious or worried for no good reason. 0  I have felt scared or panicky for no good reason. 0  Things have been getting on top of me. 0  I have been so unhappy that I have had difficulty sleeping. 0  I have felt sad or miserable. 0  I have been so unhappy that I have been crying. 0  The thought of harming myself has occurred to me. 0  Edinburgh Postnatal Depression Scale Total 0    Assessment/Plan  Active Problems:   Normal labor    Plan for discharge today.  Discharge Instructions: Per After Visit Summary. Activity: Advance as tolerated. Pelvic rest for 6 weeks.  Also refer to After Visit Summary Diet: Regular Medications: Allergies as of 07/29/2020      Reactions   Penicillins Anaphylaxis   Shellfish Allergy Swelling      Medication List    TAKE these medications   FLINSTONES GUMMIES OMEGA-3 DHA PO Take by mouth.      Outpatient follow up:   Follow-up  Information    Linzie Collin, MD Follow up in 4 day(s).   Specialties: Obstetrics and Gynecology, Radiology Contact information: [address] [place] [address] [phone]              Postpartum contraception: Will discuss at first office visit post-partum  Discharged Condition: good  Discharged to: home  Newborn Data: Disposition:home with mother  Apgars: APGAR (1 MIN): 8   APGAR (5 MINS): 9   APGAR (10 MINS):    Baby Feeding: Bottle and Breast    Elonda Husky, M.D. 07/29/2020 9:30 AM

## 2020-07-29 NOTE — Discharge Instructions (Signed)

## 2020-07-29 NOTE — Progress Notes (Signed)
Mother discharged.  Discharge instructions given.  Mother verbalizes understanding.  Transported by auxiliary.  

## 2020-08-02 ENCOUNTER — Encounter: Payer: Medicaid Other | Admitting: Obstetrics and Gynecology

## 2020-09-08 ENCOUNTER — Encounter: Payer: Medicaid Other | Admitting: Obstetrics and Gynecology

## 2020-09-09 ENCOUNTER — Encounter: Payer: Medicaid Other | Admitting: Obstetrics and Gynecology

## 2020-09-20 ENCOUNTER — Encounter: Payer: Medicaid Other | Admitting: Obstetrics and Gynecology

## 2020-09-23 ENCOUNTER — Encounter: Payer: Self-pay | Admitting: Obstetrics and Gynecology

## 2020-09-29 ENCOUNTER — Encounter: Payer: Self-pay | Admitting: Obstetrics and Gynecology

## 2021-07-28 ENCOUNTER — Ambulatory Visit: Payer: Medicaid Other

## 2021-10-25 ENCOUNTER — Ambulatory Visit: Payer: Medicaid Other

## 2021-10-28 IMAGING — US US OB < 14 WEEKS - US OB TV
1 series · 14 of 28 positions shown · non-contrast
Comparison: None.

CLINICAL DATA: Vaginal bleeding x1 day

EXAM:
OBSTETRIC <14 WK US AND TRANSVAGINAL OB US
TECHNIQUE: Both transabdominal and transvaginal ultrasound examinations were
performed for complete evaluation of the gestation as well as the
maternal uterus, adnexal regions, and pelvic cul-de-sac.
Transvaginal technique was performed to assess early pregnancy.

[Series 1: us ob < 14 weeks - us ob tv · 14 of 100 slices shown]
[im 4/100]
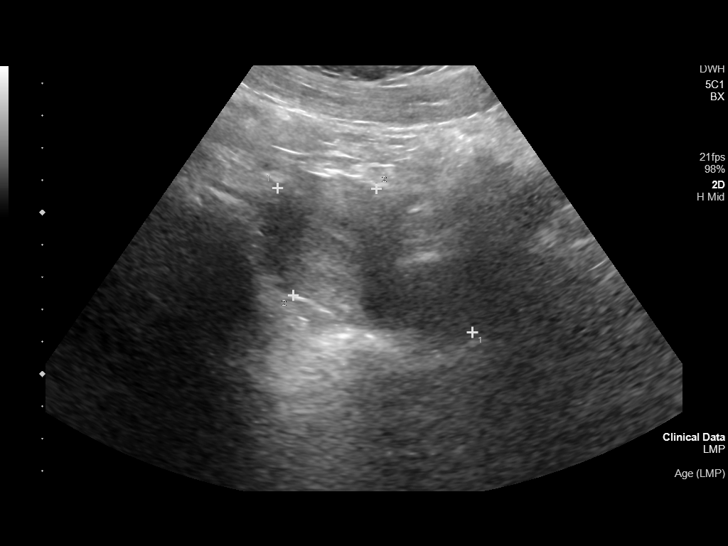
[im 12/100]
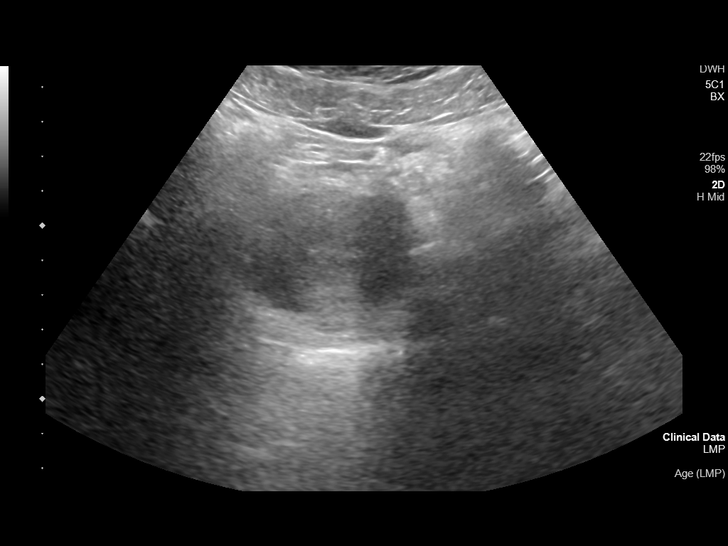
[im 19/100]
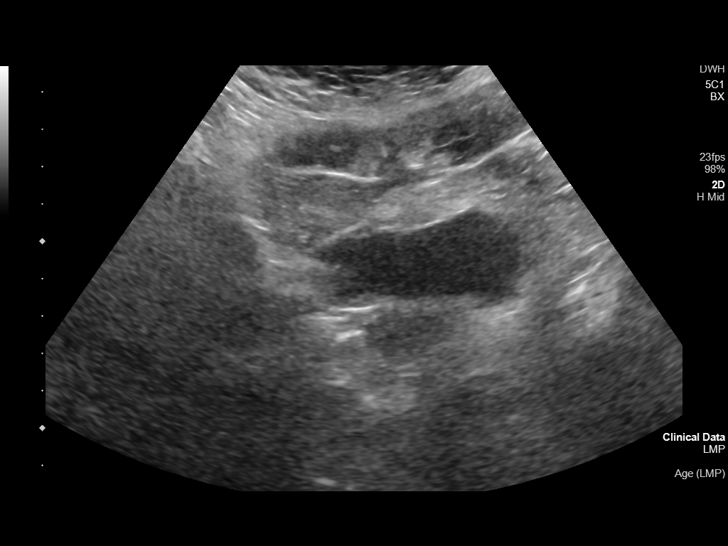
[im 26/100]
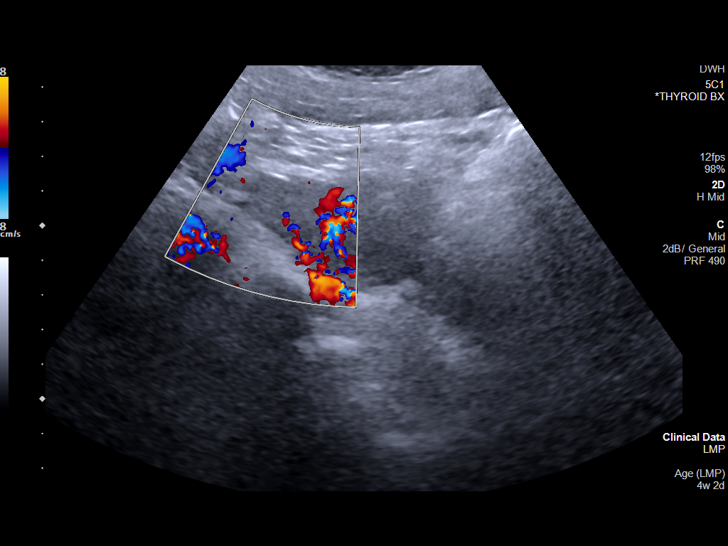
[im 34/100]
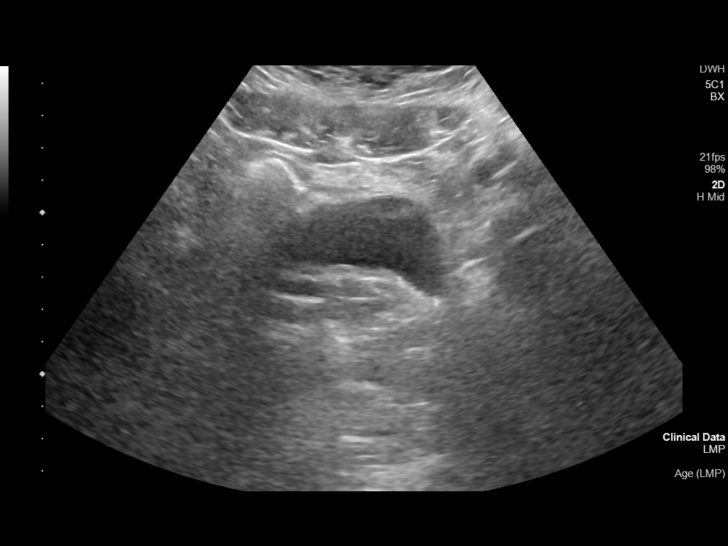
[im 41/100]
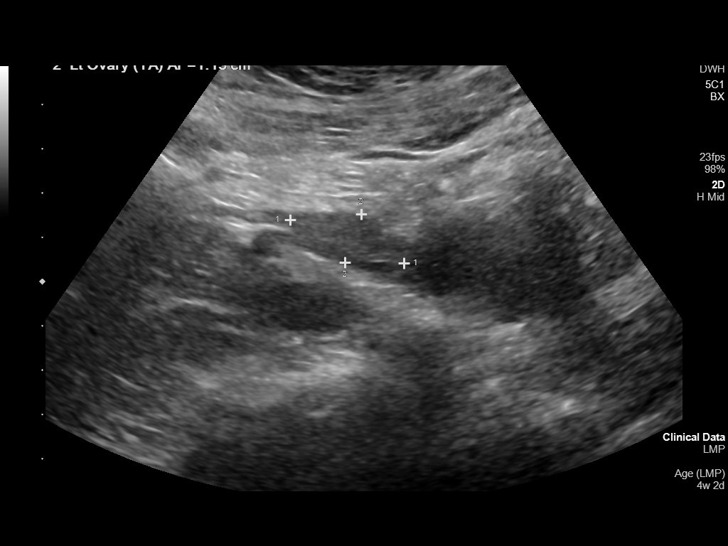
[im 48/100]
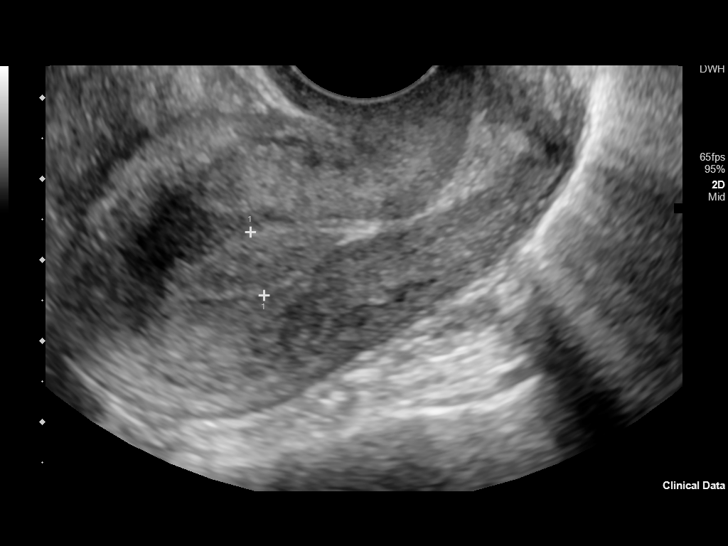
[im 56/100]
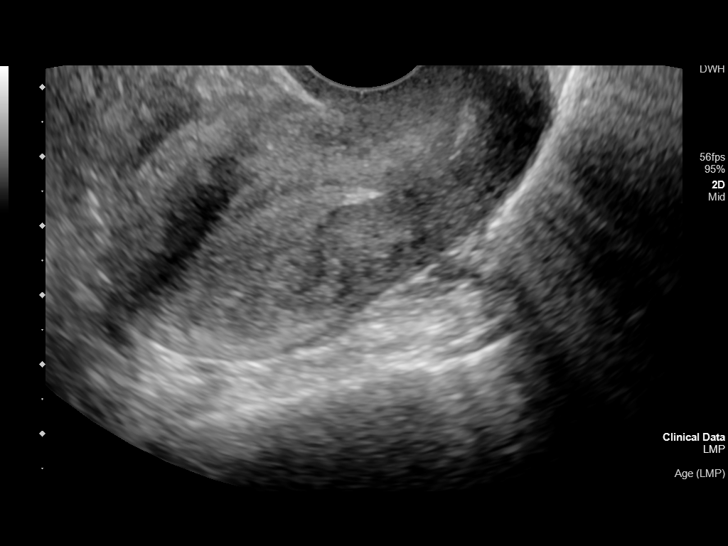
[im 63/100]
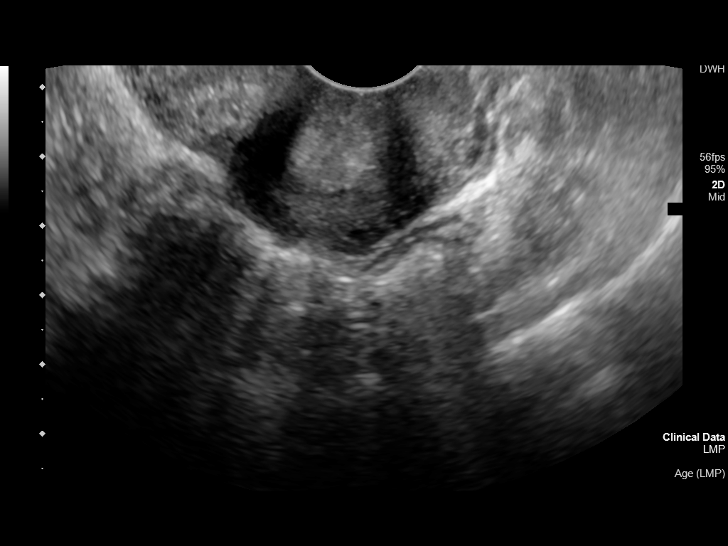
[im 70/100]
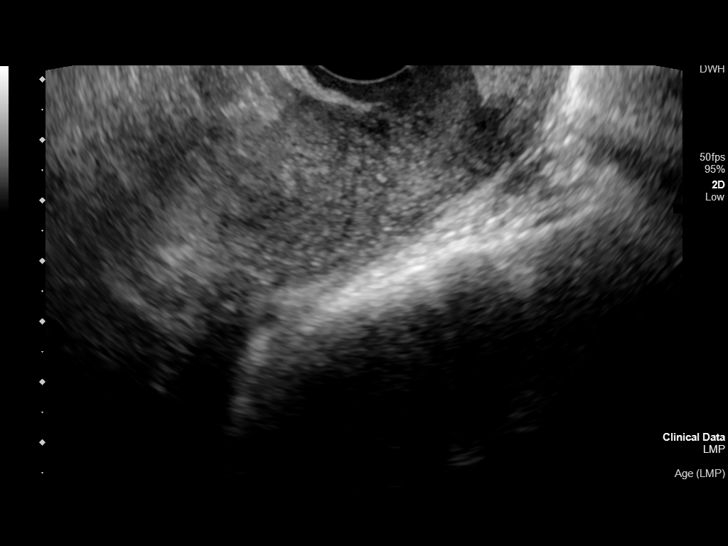
[im 78/100]
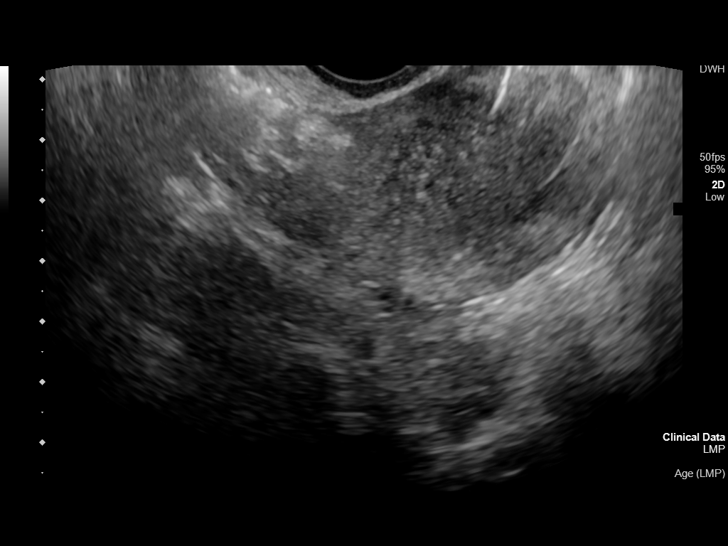
[im 85/100]
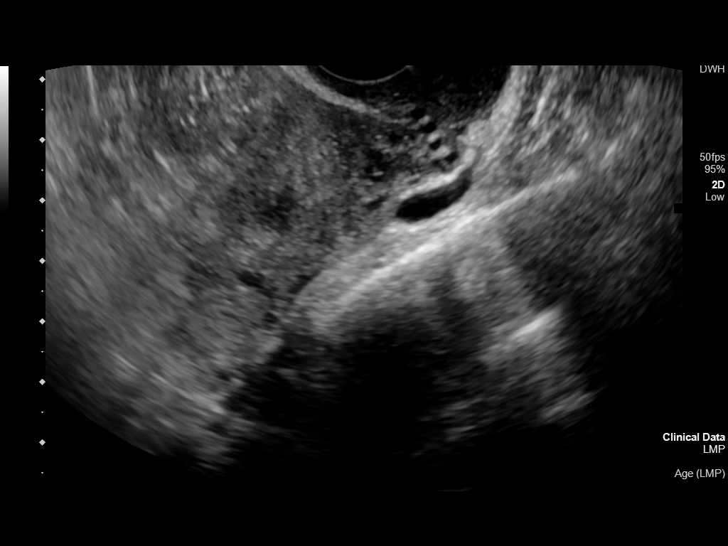
[im 92/100]
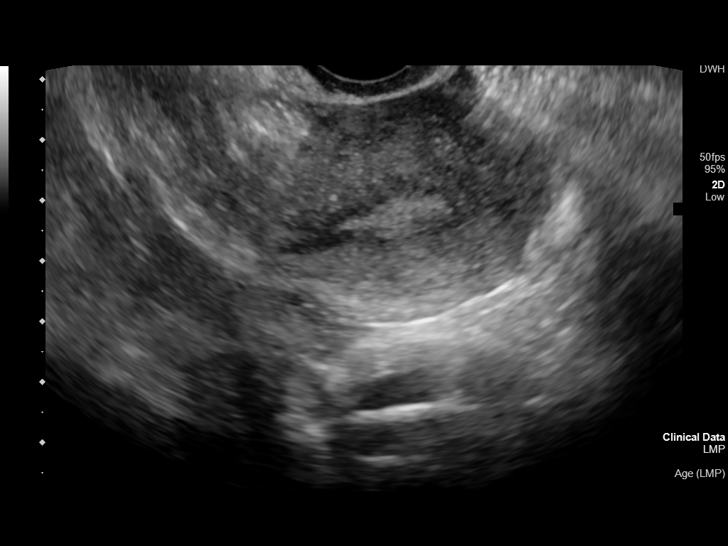
[im 100/100]
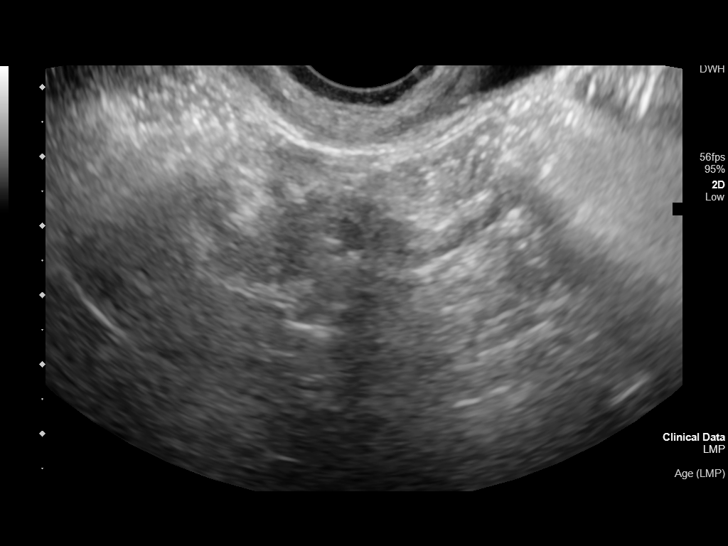

[14 of 28 positions shown; findings below may reference images not displayed]

FINDINGS: Intrauterine gestational sac: None

Yolk sac:  Not Visualized.

Embryo:  Visualized.

Maternal uterus/adnexae: In the right adnexal region there is a
x 1.1 x 0.7 cm cyst that appears to be separate from the right
ovary. The left ovary is unremarkable. The endometrium measures
approximately 8 mm in thickness.
IMPRESSION: 1.  No IUP is visualized.

By definition, in the setting of a positive pregnancy test, this
reflects a pregnancy of unknown location. Differential
considerations include early normal IUP, abnormal IUP/missed
abortion, or nonvisualized ectopic pregnancy.

Serial beta HCG is suggested. Consider repeat pelvic ultrasound in
14 days.
2. Probable 1.1 cm paraovarian cyst.

## 2021-11-01 ENCOUNTER — Ambulatory Visit: Payer: Medicaid Other

## 2024-01-21 ENCOUNTER — Ambulatory Visit

## 2024-03-09 ENCOUNTER — Encounter: Payer: Self-pay | Admitting: Family Medicine

## 2024-03-09 ENCOUNTER — Ambulatory Visit

## 2024-03-09 VITALS — BP 120/65 | Ht 62.0 in | Wt 152.0 lb

## 2024-03-09 DIAGNOSIS — Z113 Encounter for screening for infections with a predominantly sexual mode of transmission: Secondary | ICD-10-CM

## 2024-03-09 DIAGNOSIS — Z309 Encounter for contraceptive management, unspecified: Secondary | ICD-10-CM | POA: Diagnosis not present

## 2024-03-09 DIAGNOSIS — Z3200 Encounter for pregnancy test, result unknown: Secondary | ICD-10-CM

## 2024-03-09 DIAGNOSIS — Z3201 Encounter for pregnancy test, result positive: Secondary | ICD-10-CM | POA: Diagnosis not present

## 2024-03-09 DIAGNOSIS — Z01419 Encounter for gynecological examination (general) (routine) without abnormal findings: Secondary | ICD-10-CM

## 2024-03-09 DIAGNOSIS — Z3009 Encounter for other general counseling and advice on contraception: Secondary | ICD-10-CM

## 2024-03-09 DIAGNOSIS — Z124 Encounter for screening for malignant neoplasm of cervix: Secondary | ICD-10-CM

## 2024-03-09 LAB — WET PREP FOR TRICH, YEAST, CLUE
Clue Cell Exam: NEGATIVE
Trichomonas Exam: NEGATIVE
Yeast Exam: NEGATIVE

## 2024-03-09 LAB — HM HIV SCREENING LAB: HM HIV Screening: NEGATIVE

## 2024-03-09 LAB — PREGNANCY, URINE: Preg Test, Ur: POSITIVE — AB

## 2024-03-09 MED ORDER — PRENATAL VITAMIN 27-0.8 MG PO TABS: 1.0000 | ORAL_TABLET | ORAL | Status: AC

## 2024-03-09 NOTE — Progress Notes (Signed)
 " [facility] [address] [place] [address] Main phone: [phone]  Family Planning Visit - Initial Visit  Subjective:  Kaitlyn Richmond is a 28 y.o.  [id number]   being seen today for an initial annual visit and to discuss reproductive life planning.  The patient is currently using no method - no contraceptive precautions for pregnancy prevention. Patient is undecided about having a pregnancy in the next year.   Patient reports they are looking for a method with the following characteristics:  Did not desire to discuss contraceptive methods.  Patient has the following medical conditions: Patient Active Problem List   Diagnosis Date Noted   Normal labor 07/28/2020   History of chlamydia infection 06/21/2020   Supervision of normal pregnancy 12/24/2019    Chief Complaint  Patient presents with   Annual Exam    HPI Patient reports yellow discharge that started ~1  month ago, she has had intermittent episodes of  malodorous discharge. She reports she has migraine headaches and denies taking OTC medication for relief. She for reports migraines runs in the family. Currently doesn't have a PCP. Expresses no other concerns during visit today.   Review of Systems  Constitutional: Negative.   HENT: Negative.    Eyes: Negative.   Respiratory: Negative.    Cardiovascular: Negative.   Gastrointestinal: Negative.   Genitourinary: Negative.   Musculoskeletal: Negative.   Skin: Negative.   Neurological:  Positive for headaches.  Psychiatric/Behavioral: Negative.      Diabetes screening This patient is 28 y.o. with a BMI of Body mass index is 27.8 kg/m.SABRA  Is patient eligible for diabetes screening (age >35 and BMI >25)?  not applicable  Was Hgb A1c ordered? not applicable  STI screening Patient reports 3 of partners in last year.  Does this patient desire STI screening?  Yes  Hepatitis C screening Has  patient been screened once for HCV in the past?  No  No results found for: HCVAB  Does the patient meet criteria for HCV testing? No  (If yes-- Screen for HCV through [facility]) Criteria:  Since the last HCV result, does the patient have any of the following? - Current drug use - Have a partner with drug use - Has been incarcerated  Hepatitis B screening Does the patient meet criteria for HBV testing? No Criteria:  -Household, sexual or needle sharing contact with HBV -History of drug use -HIV positive -Those with known Hep C  Cervical Cancer Screening  Result Date Procedure Results Follow-ups  03/09/2024 IGP, rfx Aptima HPV ASCU DIAGNOSIS:: Comment Specimen adequacy:: Comment Clinician Provided ICD10: Comment Performed by:: Comment PAP Smear Comment: . Note:: Comment Test Methodology: Comment PAP Reflex: Comment   12/24/2019 Cytology - PAP Neisseria Gonorrhea: Negative Chlamydia: Positive (A) Trichomonas: Negative Adequacy: Satisfactory but limited for evaluation with partially obscuring Adequacy: inflammation; transformation zone component present. Diagnosis: - Negative for intraepithelial lesion or malignancy (NILM) Comment: Normal Reference Range Trichomonas - Negative Comment: Normal Reference Ranger Chlamydia - Negative Comment: Normal Reference Range Neisseria Gonorrhea - Negative   06/15/2015 HM PAP SMEAR HM Pap smear: negative for intraepithelial lesion and malignancy     Health Maintenance Due  Topic Date Due   Hepatitis B Vaccines 19-59 Average Risk (3 of 3 - 3-dose series) 08/12/1995   HPV VACCINES (3 - 3-dose series) 04/23/2012   Hepatitis C Screening  Never done   Influenza Vaccine  Never done   COVID-19 Vaccine (  2 - 2025-26 season) 11/18/2023    The following portions of the patient's history were reviewed and updated as appropriate: allergies, current medications, past family history, past medical history, past social history, past surgical history  and problem list. Problem list updated.  See flowsheet for further details and programmatic requirements Hyperlink available at the top of the signed note in blue.  Flow sheet content below:  Pregnancy Intention Screening Does the patient want to become pregnant in the next year?: No Does the patient's partner want to become pregnant in the next year?: No Would the patient like to discuss contraceptive options today?: No Other:  Password: monkey Risk Factors for Hep B Household, sexual, or needle sharing contact of a person infected with Hep B: No Sexual contact with a person who uses drugs not as prescribed?: No Currently or Ever used drugs not as prescribed: No HIV Positive: No PRep Patient: No Men who have sex with men: No Have Hepatitis C: No History of Incarceration: No History of Homeslessness?: No Anal sex following anal drug use?: No Risk Factors for Hep C Currently using drugs not as prescribed: No Sexual partner(s) currently using drugs as not prescribed: No History of drug use: No HIV Positive: No People with a history of incarceration: No People born between the years of 78 and 36: No Advise Advised client to quit or stay quit. :  (Per pt report doesn't smoke tobacco) Counseling All Patients: Provide achieving pregnancy counseling (l) Contraception Wrap Up Current Method: Pregnant/Seeking Pregnancy End Method: Pregnant/Seeking Pregnancy  Objective:   Vitals:   03/09/24 1334  BP: 120/65  Weight: 152 lb (68.9 kg)  Height: 5' 2 (1.575 m)    Physical Exam Vitals and nursing note reviewed. Exam conducted with a chaperone present Brett Orange, CMA).  Constitutional:      Appearance: Normal appearance.  HENT:     Head: Normocephalic and atraumatic.     Nose: Nose normal.     Mouth/Throat:     Mouth: Mucous membranes are moist.     Pharynx: Oropharynx is clear.  Eyes:     Conjunctiva/sclera: Conjunctivae normal.     Pupils: Pupils are equal, round,  and reactive to light.  Cardiovascular:     Rate and Rhythm: Normal rate and regular rhythm.     Pulses: Normal pulses.     Heart sounds: Normal heart sounds.  Pulmonary:     Effort: Pulmonary effort is normal.     Breath sounds: Normal breath sounds.  Chest:  Breasts:    Right: Normal. No mass or skin change.     Left: Normal. No mass or skin change.  Abdominal:     General: Abdomen is flat. There is no distension.     Palpations: Abdomen is soft.     Tenderness: There is no abdominal tenderness. There is no guarding.  Genitourinary:    General: Normal vulva.     Exam position: Lithotomy position.     Vagina: Normal.     Cervix: Friability and erythema present.     Comments: Pap and vaginal swabs collected during visit today.  Musculoskeletal:        General: Normal range of motion.     Cervical back: Normal range of motion.  Skin:    General: Skin is warm.     Capillary Refill: Capillary refill takes less than 2 seconds.     Findings: No bruising, erythema, lesion or rash.  Neurological:     General: No focal  deficit present.     Mental Status: She is alert and oriented to person, place, and time. Mental status is at baseline.  Psychiatric:        Mood and Affect: Mood normal.        Behavior: Behavior normal.        Thought Content: Thought content normal.     Assessment and Plan:  Kaitlyn Richmond is a 28 y.o. female presenting to the [facility] Department for an initial annual wellness/contraceptive visit   1. Family planning (Primary) -Patient counseled to establish routine prenatal care and initiate prenatal supplements that were provided in clinic today.  2. Well woman exam -Pap Collected during visit today. -CBE: Conducted  03/09/24 WNL.  3. Encounter for pregnancy test, result unknown -Positive UPT. - Pregnancy, urine - Prenatal Vit-Fe Fumarate-FA (PRENATAL VITAMIN) 27-0.8 MG TABS; Take 1 tablet by mouth 1 day or 1 dose for 1 dose.  4.  Screening for venereal disease -Wet Prep-negative. - WET PREP FOR TRICH, YEAST, CLUE - HIV [place] [facility] - Chlamydia/Gonorrhea [place] [facility] - Syphilis Serology, [place] [facility]  5. Cervical cancer screening -Will notify pt of results accordingly. - IGP, rfx Aptima HPV ASCU   Future Appointments  Date Time Provider Department Center  03/23/2024  9:15 AM AOB-NEW OB INTAKE NURSE AOB-AOB None  04/07/2024  4:00 PM AOB-AOB US  1 AOB-IMG None  04/21/2024  1:55 PM Slaughterbeck, Damien, CNM AOB-AOB None    Hardin GORMAN Pouch, NP "

## 2024-03-09 NOTE — Progress Notes (Signed)
 Wet prep results reviewed, no treatment per SO. Larraine JONELLE Novak, RN

## 2024-03-09 NOTE — Progress Notes (Signed)
 Pregnancy test results reviewed with pt + English packet given and reviewed.  The patient was dispensed #100 PNV today. I provided counseling today regarding the medication. We discussed the medication, the side effects and when to call clinic. Patient given the opportunity to ask questions. Questions answered.  Larraine JONELLE Novak, RN

## 2024-03-10 ENCOUNTER — Ambulatory Visit: Payer: Self-pay | Admitting: Family Medicine

## 2024-03-10 LAB — IGP, RFX APTIMA HPV ASCU: PAP Smear Comment: 0

## 2024-03-10 NOTE — Progress Notes (Signed)
 PAP smear reviewed, result: NILM (normal), HPV co-testing not indicated. Based on this result and patient's prior cervical cancer screenings, [facility] currently recommends repeat PAP smear in 3 years with HPV co-testing.  Dorothyann Helling, MD 03/10/2024  6:04 PM

## 2024-03-23 ENCOUNTER — Telehealth (INDEPENDENT_AMBULATORY_CARE_PROVIDER_SITE_OTHER)

## 2024-03-23 DIAGNOSIS — Z3481 Encounter for supervision of other normal pregnancy, first trimester: Secondary | ICD-10-CM | POA: Insufficient documentation

## 2024-03-23 NOTE — Progress Notes (Signed)
 New OB Intake  I connected with  Kaitlyn Richmond on 03/23/2024 at  9:15 AM EST by MyChart Video Visit and verified that I am speaking with the correct person using two identifiers. Nurse is located at [facility] and pt is located at work.  I discussed the limitations, risks, security and privacy concerns of performing an evaluation and management service by telephone and the availability of in person appointments. I also discussed with the patient that there may be a patient responsible charge related to this service. The patient expressed understanding and agreed to proceed.  I explained I am completing New OB Intake today. We discussed her EDD of 11/03/2024 that is based on LMP of 01/28/2024. Pt is G6/P3. I reviewed her allergies, medications, Medical/Surgical/OB history, and appropriate screenings. There are cats in the home: no. . Based on history, this is a/an pregnancy uncomplicated . Her obstetrical history is significant for N/A.  Patient Active Problem List   Diagnosis Date Noted   Normal labor 07/28/2020   History of chlamydia infection 06/21/2020   Supervision of normal pregnancy 12/24/2019    Concerns addressed today:   Delivery Plans:  Plans to deliver at [facility].  Anatomy US  Explained first scheduled US  will be 04/07/2024. Anatomy US  will be scheduled around [redacted] weeks gestational age.  Labs Discussed genetic screening with patient. Patient does want genetic testing to be drawn at new OB visit. Discussed possible labs to be drawn at new OB appointment.  COVID Vaccine Patient has had COVID vaccine.   Social Determinants of Health Food Insecurity: denies food insecurity Childcare: Discussed no children allowed at ultrasound appointments.   First visit review I reviewed new OB appt with pt. I explained she will have blood work and pap smear/pelvic exam if indicated. Explained pt will be seen by Damien Parsley, CNM  at first visit; encounter  routed to appropriate provider.   Burnard LITTIE Ro, CMA 03/23/2024  9:15 AM

## 2024-04-07 ENCOUNTER — Other Ambulatory Visit: Payer: Self-pay

## 2024-04-07 ENCOUNTER — Ambulatory Visit

## 2024-04-07 DIAGNOSIS — Z3481 Encounter for supervision of other normal pregnancy, first trimester: Secondary | ICD-10-CM

## 2024-04-07 DIAGNOSIS — Z3A10 10 weeks gestation of pregnancy: Secondary | ICD-10-CM

## 2024-04-07 DIAGNOSIS — Z3687 Encounter for antenatal screening for uncertain dates: Secondary | ICD-10-CM | POA: Diagnosis not present

## 2024-04-17 NOTE — Progress Notes (Unsigned)
 "  OBSTETRIC INITIAL PRENATAL VISIT  Subjective:    Kaitlyn Richmond is being seen today for her first obstetrical visit.  This {is/is not:9024} a planned pregnancy. She is a 29 y.o. [id number] female at [redacted]w[redacted]d gestation, Estimated Date of Delivery: 11/03/24 with Patient's last menstrual period was 01/28/2024 (exact date).,  consistent with *** week sono. Her obstetrical history is significant for {ob risk factors:10154}. Relationship with FOB: {fob:16621}. Patient {does/does not:19097} intend to breast feed. Pregnancy history fully reviewed.    OB History  Gravida Para Term Preterm AB Living  6 3 3  0 2 3  SAB IAB Ectopic Multiple Live Births  1 1 0 0 3    # Outcome Date GA Lbr Len/2nd Weight Sex Type Anes PTL Lv  6 Current           5 Term 07/28/20 [redacted]w[redacted]d  6 lb 10.9 oz (3.03 kg) M Vag-Spont None  LIV     Name: AIZZA, SANTIAGO     Apgar1: 8  Apgar5: 9  4 IAB 2020          3 SAB 2019          2 Term 10/02/15 [redacted]w[redacted]d  6 lb 13 oz (3.09 kg) M Vag-Spont None  LIV     Birth Comments: Mongolian Spots on buttocks and lower back     Name: Rowlands,BOY Angeleigh     Apgar1: 8  Apgar5: 9  1 Term 06/28/14   5 lb 1 oz (2.296 kg) M Vag-Spont EPI  LIV    Gynecologic History:  Last pap smear was ***.  Results were {Normal/Abnormal:304960160}.  {Actions; denies-reports:120008} h/o abnormal pap smears in the past.  {Actions; denies-reports:120008} history of STIs.  Contraception prior to conception:    Past Medical History:  Diagnosis Date   Hx: UTI (urinary tract infection)     Family History  Problem Relation Age of Onset   Leukemia Maternal Uncle    Diabetes Maternal Grandmother    Hypertension Paternal Grandmother     No past surgical history on file.  Social History   Socioeconomic History   Marital status: Single    Spouse name: Not on file   Number of children: 3   Years of education: 12   Highest education level: Some college, no degree  Occupational History   Not on file   Tobacco Use   Smoking status: Former   Smokeless tobacco: Never  Advertising Account Planner   Vaping status: Never Used  Substance and Sexual Activity   Alcohol use: Not Currently   Drug use: Yes    Types: Marijuana    Comment: was smoking until she found out pregnant   Sexual activity: Yes    Birth control/protection: None  Other Topics Concern   Not on file  Social History Narrative   Not on file   Social Drivers of Health   Tobacco Use: Medium Risk (03/23/2024)   Patient History    Smoking Tobacco Use: Former    Smokeless Tobacco Use: Never    Passive Exposure: Not on Actuary Strain: Low Risk (03/23/2024)   Overall Financial Resource Strain (CARDIA)    Difficulty of Paying Living Expenses: Not very hard  Food Insecurity: No Food Insecurity (03/23/2024)   Epic    Worried About Radiation Protection Practitioner of Food in the Last Year: Never true    Ran Out of Food in the Last Year: Never true  Transportation Needs: No Transportation Needs (03/23/2024)   Epic  Lack of Transportation (Medical): No    Lack of Transportation (Non-Medical): No  Physical Activity: Sufficiently Active (03/23/2024)   Exercise Vital Sign    Days of Exercise per Week: 7 days    Minutes of Exercise per Session: 150+ min  Stress: No Stress Concern Present (03/23/2024)   [employer] of Occupational Health - Occupational Stress Questionnaire    Feeling of Stress: Not at all  Social Connections: Socially Isolated (03/23/2024)   Social Connection and Isolation Panel    Frequency of Communication with Friends and Family: More than three times a week    Frequency of Social Gatherings with Friends and Family: More than three times a week    Attends Religious Services: Never    Database Administrator or Organizations: No    Attends Banker Meetings: Never    Marital Status: Never married  Intimate Partner Violence: Not At Risk (03/23/2024)   Epic    Fear of Current or Ex-Partner: No    Emotionally Abused: No     Physically Abused: No    Sexually Abused: No  Depression (PHQ2-9): Low Risk (03/23/2024)   Depression (PHQ2-9)    PHQ-2 Score: 0  Alcohol Screen: Low Risk (03/23/2024)   Alcohol Screen    Last Alcohol Screening Score (AUDIT): 2  Housing: Low Risk (03/23/2024)   Epic    Unable to Pay for Housing in the Last Year: No    Number of Times Moved in the Last Year: 0    Homeless in the Last Year: No  Utilities: Not At Risk (03/23/2024)   Epic    Threatened with loss of utilities: No  Health Literacy: Adequate Health Literacy (03/23/2024)   B1300 Health Literacy    Frequency of need for help with medical instructions: Never    Medications Ordered Prior to Encounter[1]  Allergies[2]   Review of Systems General: Not Present- Fever, Weight Loss and Weight Gain. Skin: Not Present- Rash. HEENT: Not Present- Blurred Vision, Headache and Bleeding Gums. Respiratory: Not Present- Difficulty Breathing. Breast: Not Present- Breast Mass. Cardiovascular: Not Present- Chest Pain, Elevated Blood Pressure, Fainting / Blacking Out and Shortness of Breath. Gastrointestinal: Not Present- Abdominal Pain, Constipation, Nausea and Vomiting. Female Genitourinary: Not Present- Frequency, Painful Urination, Pelvic Pain, Vaginal Bleeding, Vaginal Discharge, Contractions, regular, Fetal Movements Decreased, Urinary Complaints and Vaginal Fluid. Musculoskeletal: Not Present- Back Pain and Leg Cramps. Neurological: Not Present- Dizziness. Psychiatric: Not Present- Depression.     Objective:   Last menstrual period 01/28/2024, unknown if currently breastfeeding.  There is no height or weight on file to calculate BMI.  General Appearance:    Alert, cooperative, no distress, appears stated age  Head:    Normocephalic, without obvious abnormality, atraumatic  Eyes:    PERRL, conjunctiva/corneas clear, EOM's intact, both eyes  Ears:    Normal external ear canals, both ears  Nose:   Nares normal, septum midline, mucosa  normal, no drainage or sinus tenderness  Throat:   Lips, mucosa, and tongue normal; teeth and gums normal  Neck:   Supple, symmetrical, trachea midline, no adenopathy; thyroid: no enlargement/tenderness/nodules; no carotid bruit or JVD  Back:     Symmetric, no curvature, ROM normal, no CVA tenderness  Lungs:     Clear to auscultation bilaterally, respirations unlabored  Chest Wall:    No tenderness or deformity   Heart:    Regular rate and rhythm, S1 and S2 normal, no murmur, rub or gallop  Breast Exam:  No tenderness, masses, or nipple abnormality  Abdomen:     Soft, non-tender, bowel sounds active all four quadrants, no masses, no organomegaly.  FHT ***  bpm.  Genitalia:    Pelvic:external genitalia normal, vagina without lesions, discharge, or tenderness, rectovaginal septum  normal. Cervix normal in appearance, no cervical motion tenderness, no adnexal masses or tenderness.  Pregnancy positive findings: uterine enlargement: *** wk size, nontender.   Rectal:    Normal external sphincter.  No hemorrhoids appreciated. Internal exam not done.   Extremities:   Extremities normal, atraumatic, no cyanosis or edema  Pulses:   2+ and symmetric all extremities  Skin:   Skin color, texture, turgor normal, no rashes or lesions  Lymph nodes:   Cervical, supraclavicular, and axillary nodes normal  Neurologic:   CNII-XII intact, normal strength, sensation and reflexes throughout     Assessment:   No diagnosis found.  Plan:   Supervision of ***normal/high risk pregnancy  - Initial labs reviewed. - Prenatal vitamins encouraged. - Problem list reviewed and updated. - New OB counseling:  The patient has been given an overview regarding routine prenatal care.  Recommendations regarding diet, weight gain, and exercise in pregnancy were given. - Prenatal testing, optional genetic testing, and ultrasound use in pregnancy were reviewed.  Traditional genetic screening vs cell-fee DNA genetic screening  discussed, including risks and benefits. Testing {requests/ordered/declines:14581}. - Benefits of Breast Feeding were discussed. The patient is encouraged to consider nursing her baby post partum.  There are no diagnoses linked to this encounter.    Follow up in 4 weeks.    Damien Parsley, CNM [facility]     [1]  Current Outpatient Medications on File Prior to Visit  Medication Sig Dispense Refill   Pediatric Multiple Vit-C-FA (FLINSTONES GUMMIES OMEGA-3 DHA PO) Take by mouth. (Patient not taking: Reported on 03/23/2024)     prenatal vitamin w/FE, FA (PRENATAL 1 + 1) 27-1 MG TABS tablet Take 1 tablet by mouth daily at 12 noon.     No current facility-administered medications on file prior to visit.  [2]  Allergies Allergen Reactions   Penicillins Anaphylaxis   Shellfish Allergy Swelling   "

## 2024-04-21 ENCOUNTER — Encounter: Admitting: Certified Nurse Midwife

## 2024-04-21 DIAGNOSIS — Z131 Encounter for screening for diabetes mellitus: Secondary | ICD-10-CM

## 2024-04-21 DIAGNOSIS — Z130 Encounter for screening for diseases of the blood and blood-forming organs and certain disorders involving the immune mechanism: Secondary | ICD-10-CM

## 2024-04-21 DIAGNOSIS — Z3A12 12 weeks gestation of pregnancy: Secondary | ICD-10-CM

## 2024-04-21 DIAGNOSIS — Z1379 Encounter for other screening for genetic and chromosomal anomalies: Secondary | ICD-10-CM

## 2024-04-21 DIAGNOSIS — Z113 Encounter for screening for infections with a predominantly sexual mode of transmission: Secondary | ICD-10-CM

## 2024-04-21 DIAGNOSIS — Z3481 Encounter for supervision of other normal pregnancy, first trimester: Secondary | ICD-10-CM

## 2024-04-21 DIAGNOSIS — Z0283 Encounter for blood-alcohol and blood-drug test: Secondary | ICD-10-CM

## 2024-04-21 DIAGNOSIS — Z114 Encounter for screening for human immunodeficiency virus [HIV]: Secondary | ICD-10-CM

## 2024-04-22 NOTE — Patient Instructions (Signed)
 Second Trimester of Pregnancy: What to Know  The second trimester of pregnancy is from week 14 through week 27. This is months 4 through 6 of pregnancy. During the second trimester: Morning sickness is less or has stopped. You may have more energy. You may feel hungry more often. At this time, your unborn baby is growing very fast. At the end of the sixth month, the unborn baby may be up to 12 inches long and weigh about 1 pounds. You will likely start to feel the baby move between 16 and 20 weeks of pregnancy. Body changes during your second trimester Your body continues to change during this time. The changes usually go away after your baby is born. Physical changes You will gain more weight. Your belly will get bigger. You may begin to get stretch marks on your hips, belly, and breasts. Your breasts will keep growing and may hurt. You may get dark spots or blotches on your face. A dark line from your belly button to the pubic area may appear. This line is called linea nigra. Your hair may grow faster and get thicker. Health changes You may have headaches. You may have heartburn. You may pee more often. You may have swollen, bulging veins (varicose veins). You may have trouble pooping (constipation), or swollen veins in the butt that can itch or get painful (hemorrhoids). You may have back pain. This is caused by: Weight gain. Pregnancy hormones that are relaxing the joints in your pelvis. Follow these instructions at home: Medicines Talk to your health care provider if you're taking medicines. Ask if the medicines are safe to take during pregnancy. Your provider may change the medicines that you take. Do not take any medicines unless told to by your provider. Take a prenatal vitamin that has at least 600 micrograms (mcg) of folic acid. Do not use herbal medicines, illegal drugs, or medicines that are not approved by your provider. Eating and drinking While you're pregnant your  body needs extra food for your growing baby. Talk with your provider about what to eat while pregnant. Activity Most women are able to exercise during pregnancy. Exercises may need to change as your pregnancy goes on. Talk to your provider about your activities and exercise routines. Relieving pain and discomfort Wear a good, supportive bra if your breasts hurt. Rest with your legs raised if you have leg cramps or low back pain. Take warm sitz baths to soothe pain from hemorrhoids. Use hemorrhoid cream if your provider says it's okay. Do not douche. Do not use tampons or scented pads. Do not use hot tubs, steam rooms, or saunas. Safety Wear your seatbelt at all times when you're in a car. Talk to your provider if someone hits you, hurts you, or yells at you. Talk with your provider if you're feeling sad or have thoughts of hurting yourself. Lifestyle Certain things can be harmful while you're pregnant. It's best to avoid the following: Do not drink alcohol,smoke, vape, or use products with nicotine or tobacco in them. If you need help quitting, talk with your provider. Avoid cat litter boxes and soil used by cats. These things carry germs that can cause harm to your pregnancy and your baby. General instructions Keep all follow-up visits. It helps you and your unborn baby stay as healthy as possible. Write down your questions. Take them to your prenatal visits. Your provider will: Talk with you about your overall health. Give you advice or refer you to specialists who can help  with different needs, including: Prenatal education classes. Mental health and counseling. Foods and healthy eating. Ask for help if you need help with food. Where to find more information American Pregnancy Association: americanpregnancy.org [employer]: acog.org Office on [employer] Health: [place].ca Contact a health care provider if: You have a headache that does not  go away when you take medicine. You have any of these problems: You can't eat or drink. You throw up or feel like you may throw up. You have watery poop (diarrhea) for 2 days or more. You have pain when you pee or your pee smells bad. You have been sick for 2 days or more and are not getting better. Contact your provider right away if: You have any of these coming from your vagina: Abnormal discharge. Bad-smelling fluid. Bleeding. Your baby is moving less than usual. You have contractions, belly cramping, or have pain in your pelvis or lower back. You have symptoms of high blood pressure or preeclampsia. These include: A severe, throbbing headache that does not go away. Sudden or extreme swelling of your face, hands, legs, or feet. Vision problems: You see spots. You have blurry vision. Your eyes are sensitive to light. If you can't reach the provider, go to an urgent care or emergency room. Get help right away if: You faint, become confused, or can't think clearly. You have chest pain or trouble breathing. You have any kind of injury, such as from a fall or a car crash. These symptoms may be an emergency. Call 911 right away. Do not wait to see if the symptoms will go away. Do not drive yourself to the hospital. This information is not intended to replace advice given to you by your health care provider. Make sure you discuss any questions you have with your health care provider. Document Revised: 01/16/2024 Document Reviewed: 07/06/2022 Elsevier Patient Education  2025 [employer]. Pregnancy: Healthy Eating While you're pregnant, your body needs extra nutrition for your growing baby. You also need more vitamins and minerals, such as folic acid, calcium, iron, and vitamin D. Eating a balanced diet is important for both you and your baby. Your need for extra calories will change during pregnancy. During the first 3 months of pregnancy, called the first trimester, you don't need  more calories. During the second trimester, you'll need about 340 extra calories a day. During the third trimester, you'll need about 450 extra calories a day. If you're carrying more than one baby, talk with your health care provider or a dietitian to learn more about your specific eating needs. What are tips for eating healthy during pregnancy? Meal planning  Eating smaller meals throughout the day may help manage some side effects common in pregnancy, like heartburn and reflux. Eat a variety of foods. Be sure to include many types of fruits and vegetables. Two or more servings of fish are recommended each week. Choose fish that are lower in mercury, such as salmon and pollock. Limit foods that have empty calories. These are foods that have little nutritional value, such as sweets, desserts, candies, and drinks with sugar in them. Drinks that have caffeine are OK to drink, but it's better to avoid caffeine. Limit your total caffeine intake to less than 200 mg each day, or the limit you're told by your provider. Be aware that 200 mg of caffeine is 12 oz or 355 mL of coffee, tea, or soda. General information Take a prenatal vitamin to help meet your vitamin and  mineral needs during pregnancy. This includes your need for folic acid, iron, calcium, and vitamin D. Do not try to lose weight or go on a diet during pregnancy. Food safety  Wash your hands before you eat and after you prepare raw meat. Wash all fruits and vegetables well before peeling or eating. Make sure that all meats, poultry, and eggs are cooked to food-safe temperatures or well-done. Taking these actions can help keep your food safe and protect you and your baby from dangerous food illnesses. Ask your provider for more information. What foods should I eat? Fruits All fruits. Eat a variety of colors and types of fruit. Remember to wash your fruits well before peeling or eating. Vegetables All vegetables. Eat a variety of  colors and types of vegetables. Remember to wash your vegetables well before peeling or eating. Grains All grains. Choose whole grains, such as whole-wheat bread, oatmeal, or brown rice. Meats and other protein foods Lean meats, including chicken, turkey, and lean cuts of beef, veal, or pork. Fish that is higher in omega-3 fatty acids and lower in mercury, such as salmon, herring, mussels, trout, sardines, pollock, shrimp, crab, and lobster. Tofu. Tempeh. Beans. Eggs. Peanut butter and other nut butters. Dairy Pasteurized milk and milk alternatives, such as soy milk. Pasteurized yogurt and pasteurized cheese. Cottage cheese. Sour cream. Beverages Water. Juices that contain 100% fruit juice or vegetable juice. Caffeine-free teas and decaffeinated coffee. Fats and oils Fats and oils are OK to include in moderation. Sweets and desserts Sweets and desserts are OK to include in moderation. Seasoning and other foods All pasteurized condiments. The items listed above may not be all the foods and drinks you can have. Talk with a dietitian to learn more. What does 340 extra calories look like? Healthy snacks that give you 340 more calories a day could be: Peanut butter and jelly with milk: 8 oz (237 mL) of low-fat milk. Peanut butter and jelly sandwich made with: 1 slice of whole-wheat bread. 2 teaspoons (10 g) of peanut butter. Yogurt and berries: 1 cup (245 g) of Greek yogurt. 1 cup (150 g) of berries. 2 tablespoons (30 g) of chopped nuts, such as almonds or walnuts. Avocado toast: 1 slice of whole-wheat bread. 1/2 medium avocado (70 g). 1 large egg (50 g). What foods should I avoid? Fruits Raw (unpasteurized) fruit juices. Vegetables Unpasteurized vegetable juices. Meats and other protein foods Precooked or cured meat, such as bologna, hot dogs, sausages, or meat loaves. (If you must eat those meats, reheat them until they are steaming hot.) Refrigerated pate, meat spreads from a meat  counter, or smoked seafood that's found in the refrigerated section of a store. Raw or undercooked meats, poultry, and eggs. Raw fish, such as sushi or sashimi. Fish that have high mercury content, such as tilefish, shark, swordfish, and king mackerel. Dairy Unpasteurized or raw milk and any foods that are made from them. Some of these may be: Homemade yogurts or puddings. Soft cheeses such as: Feta. Queso blanco or fresco. Pharmacist, Hospital or [place]. Blue-veined cheeses. Some of these types of cheeses may be made with pasteurized milk. Check the label. If pasteurized milk is used, they are OK to eat during pregnancy. Deli foods Premade foods from a store or deli, like chicken salad, coleslaw, or egg salad. These are riskier for food illness than fresh or homemade salads. Beverages Alcohol. Sugar-sweetened drinks, such as sodas or teas. Energy drinks. Seasoning and other foods Homemade fermented foods and drinks, such as:  Dene. Sauerkraut. Kombucha. Store-bought pasteurized versions of these are OK. The items listed above may not be all the foods and drinks you should avoid. Talk with a dietitian to learn more. Where to find more information To learn more, go to: Centers for Disease Control and Prevention at tonerpromos.no. Click Search and type food choices for pregnancy. Find the link you need. MyPlate at http://pittman-dennis.biz/. This information is not intended to replace advice given to you by your health care provider. Make sure you discuss any questions you have with your health care provider. Document Revised: 02/20/2023 Document Reviewed: 02/20/2023 Elsevier Patient Education  2025 [employer]. Exercise During Pregnancy Exercise is an important part of being healthy for people of all ages. Exercise helps your heart and lungs work well. Exercise also: Helps you stay strong and flexible. Helps you keep a healthy body weight. Boosts your energy levels and improves your mood. You  should try to exercise regularly during pregnancy. Exercise routines may need to change later in your pregnancy. In rare cases, certain medical problems in your pregnancy may limit the exercise you can do during pregnancy. Your health care provider will give you information on what exercises will work for you. How does exercise help during pregnancy? Along with staying strong and flexible, exercising during pregnancy can help: Keep strength in muscles that are used during labor and birth. Control weight gain. Speed up your recovery after giving birth. Reduce the need for insulin if you get diabetes during pregnancy. Decrease low back pain. Lower the risk for depression. Lower the risk of cesarean delivery. Treat trouble pooping (constipation). How does exercise affect my baby? Exercise can help you have a healthy pregnancy. Exercise does not cause your baby to be born early. It will not cause your baby to weigh less at birth. What exercises can I do? Many exercises are safe for you to do during pregnancy. Do a variety of exercises that safely increase your heart and breathing rates and help you build and maintain muscle strength. Do exercises as told by your provider. Your provider may recommend: Walking. Swimming. Water aerobics. Riding a stationary bike. Modified yoga or Pilates. Tell your instructor that you're pregnant. Avoid overstretching. Avoid lying on your back for long periods of time. Resistance exercises with weights or elastic bands. Running or jogging. Choose this type of exercise only if: You ran or jogged regularly before your pregnancy. You can run or jog and still talk in full sentences. What exercises should I avoid? You may be told to limit high-intensity exercise depending on your level of fitness and if you exercised regularly before you became pregnant. You can tell that you're exercising at a high intensity if you're breathing much harder and faster and can't hold a  conversation while exercising. You may be told to: Avoid jogging or running, unless you jogged or ran regularly before you became pregnant. Do not run or jog so fast that you're unable to have a conversation. Avoid activities that put you at risk for falling on your belly or getting hit in the belly. Some of these are: Downhill skiing. Rock climbing. Cycling and gymnastics. Horseback riding. Surfing and waterskiing. Contact sports. Avoid scuba diving. Avoid skydiving. Avoid activities that take place in a room that's heated to high temperatures, such as hot yoga or hot Pilates. How do I exercise in a safe way?  Start slowly. Ask your provider to recommend the types of exercise that are safe for you. Avoid overheating. Do not exercise  in very high temperatures or hot rooms. Avoid hot yoga or hot Pilates. Avoid standing still or lying flat on your back as much as you can. Avoid losing too much fluid (dehydration). Drink more fluids as told. Drink before, during, and after you exercise. Avoid overstretching. Because of hormone changes during pregnancy, it's easy to overstretch muscles, tendons, and ligaments. Ligaments are the tissues that connect bones to each other. Do not exercise to lose weight. Do not exercise at more than 6,000 feet above sea level (high elevation) if you don't live at that elevation. Tips and recommendations Wear loose-fitting, breathable clothes. Wear a sports bra to support your breasts. Exercise on most days or all days of the week. Try to exercise for 30 minutes a day, 5 days a week. If problems come up during your pregnancy, you provider may tell you to limit some exercises or to exercise less. If you have concerns, ask your provider. If you actively exercised before your pregnancy, your provider may tell you to continue to do moderate-intensity to high-intensity exercise. If you're just starting to exercise or didn't exercise much before your pregnancy, your  provider may tell you to do low-intensity to moderate-intensity exercise. Questions to ask your health care provider Is exercise safe for me? What are signs that I should stop exercising? Does my health condition mean that I should not exercise during pregnancy? When should I avoid exercising during pregnancy? Stop exercising and contact a health care provider if: You have any unusual symptoms, such as: Mild contractions or cramps in the belly. Dizziness that does not go away when you rest. Headache. Pain and swelling of your calves. Bleeding or fluid leaking from your vagina. Stop exercising and get help right away if: You have: Chest pain. Shortness of breath. Sudden, severe pain in your low back or your belly. Regular, painful contractions before 37 weeks of pregnancy. These symptoms may be an emergency. Call 911 right away. Do not wait to see if the symptoms will go away. Do not drive yourself to the hospital. This information is not intended to replace advice given to you by your health care provider. Make sure you discuss any questions you have with your health care provider. Document Revised: 10/29/2022 Document Reviewed: 10/29/2022 Elsevier Patient Education  2024 [employer] Stretched Tissue in the Pelvic Area Causing Pain During Pregnancy (Round Ligament Pain): What It Means  Round ligament pain is common during pregnancy. The round ligaments are two cord-like parts that hold up the uterus. They stretch and get soft as the baby grows, which can cause pain.  This pain usually starts in the second trimester (13-28 weeks) of pregnancy and does not hurt the baby. The pain is usually sharp and feels like a pinch. It lasts only a few seconds but can come and go until the baby is born. The pain may be felt in the belly, hip, or groin. The pain comes when: You change positions quickly, like when you go from a sitting to a standing position. You do physical activity. You cough,  laugh, or sneeze. Follow these instructions at home: How to manage round ligament pain: Bend your knees up to your belly when you need to sneeze, laugh, or cough. Lie on your side with one pillow under your belly and another pillow between your legs. Sit in a warm bath for 15-20 minutes or until the pain goes away. Rest as told. Take your medicines only as told. General instructions Stop or reduce your physical  activities if they cause pain. Move slowly when you sit down or stand up. Avoid sudden movements. Use a support belt under your belly. Do stretching exercises. Contact a health care provider if: Your pain doesn't go away or gets worse. You feel pain in your back that you didn't have before. Your medicine isn't helping. You have fever or chills. You throw up, or you feel like throwing up. You have watery poop (diarrhea). You have pain when you pee. Get help right away if: Your back pain is a rhythmic, cramping pain similar to labor pains. Labor pains are usually 2 minutes apart and last for about 1 minute. Labor pains involve a bearing-down feeling or pressure in your pelvis. You have back pain and your water breaks. You have bleeding from your vagina. These symptoms may be an emergency. Call 911 right away. Do not wait to see if the symptoms will go away. Do not drive yourself to the hospital. This information is not intended to replace advice given to you by your health care provider. Make sure you discuss any questions you have with your health care provider. Document Revised: 08/19/2023 Document Reviewed: 08/19/2023 Elsevier Patient Education  2025 [employer].

## 2024-04-23 ENCOUNTER — Ambulatory Visit: Admitting: Registered Nurse

## 2024-04-23 ENCOUNTER — Encounter: Payer: Self-pay | Admitting: Registered Nurse

## 2024-04-23 VITALS — BP 120/79 | HR 87 | Wt 158.1 lb

## 2024-04-23 DIAGNOSIS — Z3481 Encounter for supervision of other normal pregnancy, first trimester: Secondary | ICD-10-CM

## 2024-04-23 DIAGNOSIS — Z3A12 12 weeks gestation of pregnancy: Secondary | ICD-10-CM | POA: Insufficient documentation

## 2024-04-23 DIAGNOSIS — Z0184 Encounter for antibody response examination: Secondary | ICD-10-CM

## 2024-04-23 DIAGNOSIS — Z130 Encounter for screening for diseases of the blood and blood-forming organs and certain disorders involving the immune mechanism: Secondary | ICD-10-CM | POA: Insufficient documentation

## 2024-04-23 DIAGNOSIS — Z113 Encounter for screening for infections with a predominantly sexual mode of transmission: Secondary | ICD-10-CM | POA: Insufficient documentation

## 2024-04-23 DIAGNOSIS — Z1379 Encounter for other screening for genetic and chromosomal anomalies: Secondary | ICD-10-CM

## 2024-04-23 DIAGNOSIS — Z0283 Encounter for blood-alcohol and blood-drug test: Secondary | ICD-10-CM

## 2024-04-23 MED ORDER — ONDANSETRON 4 MG PO TBDP: 4.0000 mg | ORAL_TABLET | Freq: Three times a day (TID) | ORAL | 5 refills | Status: AC | PRN

## 2024-04-23 NOTE — Progress Notes (Addendum)
 "  OBSTETRIC INITIAL PRENATAL VISIT  Subjective:    Kaitlyn Richmond is being seen today for her first obstetrical visit.  This is not a planned pregnancy. She is a 29 y.o. [id number] female at [redacted]w[redacted]d gestation, Estimated Date of Delivery: 11/03/24 with Patient's last menstrual period was 01/28/2024 (exact date).,  consistent with 10 week sono. Her obstetrical history is significant for none. Patient does intend to breast feed. Pregnancy history fully reviewed.    OB History  Gravida Para Term Preterm AB Living  6 3 3  0 2 3  SAB IAB Ectopic Multiple Live Births  1 1 0 0 3    # Outcome Date GA Lbr Len/2nd Weight Sex Type Anes PTL Lv  6 Current           5 Term 07/28/20 [redacted]w[redacted]d  6 lb 10.9 oz (3.03 kg) M Vag-Spont None  LIV     Name: BRUCE, MAYERS     Apgar1: 8  Apgar5: 9  4 IAB 2020          3 SAB 2019          2 Term 10/02/15 [redacted]w[redacted]d  6 lb 13 oz (3.09 kg) M Vag-Spont None  LIV     Birth Comments: Mongolian Spots on buttocks and lower back     Name: Rathod,BOY Tala     Apgar1: 8  Apgar5: 9  1 Term 06/28/14   5 lb 1 oz (2.296 kg) M Vag-Spont EPI  LIV    Gynecologic History:  Last pap smear was 02/2024.  Results were Normal.   Past Medical History:  Diagnosis Date   Hx: UTI (urinary tract infection)     Family History  Problem Relation Age of Onset   Leukemia Maternal Uncle    Diabetes Maternal Grandmother    Hypertension Paternal Grandmother     No past surgical history on file.  Social History   Socioeconomic History   Marital status: Single    Spouse name: Not on file   Number of children: 3   Years of education: 12   Highest education level: Some college, no degree  Occupational History   Not on file  Tobacco Use   Smoking status: Former   Smokeless tobacco: Never  Advertising Account Planner   Vaping status: Never Used  Substance and Sexual Activity   Alcohol use: Not Currently   Drug use: Not Currently    Types: Marijuana    Comment: was smoking until she found out  pregnant   Sexual activity: Yes    Birth control/protection: None  Other Topics Concern   Not on file  Social History Narrative   Not on file   Social Drivers of Health   Tobacco Use: Medium Risk (04/23/2024)   Patient History    Smoking Tobacco Use: Former    Smokeless Tobacco Use: Never    Passive Exposure: Not on Actuary Strain: Low Risk (03/23/2024)   Overall Financial Resource Strain (CARDIA)    Difficulty of Paying Living Expenses: Not very hard  Food Insecurity: No Food Insecurity (03/23/2024)   Epic    Worried About Radiation Protection Practitioner of Food in the Last Year: Never true    Ran Out of Food in the Last Year: Never true  Transportation Needs: No Transportation Needs (03/23/2024)   Epic    Lack of Transportation (Medical): No    Lack of Transportation (Non-Medical): No  Physical Activity: Sufficiently Active (03/23/2024)   Exercise Vital Sign  Days of Exercise per Week: 7 days    Minutes of Exercise per Session: 150+ min  Stress: No Stress Concern Present (03/23/2024)   [employer] of Occupational Health - Occupational Stress Questionnaire    Feeling of Stress: Not at all  Social Connections: Socially Isolated (03/23/2024)   Social Connection and Isolation Panel    Frequency of Communication with Friends and Family: More than three times a week    Frequency of Social Gatherings with Friends and Family: More than three times a week    Attends Religious Services: Never    Database Administrator or Organizations: No    Attends Banker Meetings: Never    Marital Status: Never married  Intimate Partner Violence: Not At Risk (03/23/2024)   Epic    Fear of Current or Ex-Partner: No    Emotionally Abused: No    Physically Abused: No    Sexually Abused: No  Depression (PHQ2-9): Low Risk (03/23/2024)   Depression (PHQ2-9)    PHQ-2 Score: 0  Alcohol Screen: Low Risk (03/23/2024)   Alcohol Screen    Last Alcohol Screening Score (AUDIT): 2  Housing: Low Risk  (03/23/2024)   Epic    Unable to Pay for Housing in the Last Year: No    Number of Times Moved in the Last Year: 0    Homeless in the Last Year: No  Utilities: Not At Risk (03/23/2024)   Epic    Threatened with loss of utilities: No  Health Literacy: Adequate Health Literacy (03/23/2024)   B1300 Health Literacy    Frequency of need for help with medical instructions: Never    Medications Ordered Prior to Encounter[1]  Allergies[2]   Review of Systems General: Not Present- Fever, Weight Loss and Weight Gain. Skin: Not Present- Rash. HEENT: Not Present- Blurred Vision, Headache and Bleeding Gums. Respiratory: Not Present- Difficulty Breathing. Breast: Not Present- Breast Mass. Cardiovascular: Not Present- Chest Pain, Elevated Blood Pressure, Fainting / Blacking Out and Shortness of Breath. Gastrointestinal: Not Present- Abdominal Pain, Constipation, Nausea and Vomiting. Female Genitourinary: Not Present- Frequency, Painful Urination, Pelvic Pain, Vaginal Bleeding, Vaginal Discharge, Contractions, regular, Fetal Movements Decreased, Urinary Complaints and Vaginal Fluid. Musculoskeletal: Not Present- Back Pain and Leg Cramps. Neurological: Not Present- Dizziness. Psychiatric: Not Present- Depression.     Objective:   Blood pressure 120/79, pulse 87, weight 158 lb 1.6 oz (71.7 kg), last menstrual period 01/28/2024, unknown if currently breastfeeding.  Body mass index is 28.92 kg/m. Had annual exam 02/2024.  General Appearance:    Alert, cooperative, no distress, appears stated age  Lungs:     Clear to auscultation bilaterally, respirations unlabored  Chest Wall:    No tenderness or deformity   Heart:    Regular rate and rhythm, S1 and S2 normal, no murmur, rub or gallop  Abdomen:     Soft, non-tender, bowel sounds active all four quadrants, no masses, no organomegaly.  FHT 150  bpm.   EPDS 0 Assessment:   1. [redacted] weeks gestation of pregnancy   2. Encounter for supervision of other  normal pregnancy, first trimester   3. Encounter for drug screening   4. Genetic screening   5. Screening examination for STD (sexually transmitted disease)   6. Screening for iron deficiency anemia   7. Immunity status testing     Plan:   Supervision of normal multiparous pregnancy  - Initial labs ordered. - Prenatal vitamins encouraged. - Problem list reviewed and updated. - New OB counseling:  The patient has been given an overview regarding routine prenatal care.  Recommendations regarding diet, weight gain, and exercise in pregnancy were given. - Prenatal testing, optional genetic testing, and ultrasound use in pregnancy were reviewed.  Traditional genetic screening vs cell-fee DNA genetic screening discussed, including risks and benefits. Testing requested. - Benefits of Breast Feeding were discussed. The patient is encouraged to consider nursing her baby post partum. -Does not meet criteria for ASA due to no preeclampsia risk factors    Follow up in 4 weeks.   Lauraine Lakes, CNM  [facility]     [1]  Current Outpatient Medications on File Prior to Visit  Medication Sig Dispense Refill   prenatal vitamin w/FE, FA (PRENATAL 1 + 1) 27-1 MG TABS tablet Take 1 tablet by mouth daily at 12 noon.     Pediatric Multiple Vit-C-FA (FLINSTONES GUMMIES OMEGA-3 DHA PO) Take by mouth. (Patient not taking: Reported on 04/23/2024)     No current facility-administered medications on file prior to visit.  [2]  Allergies Allergen Reactions   Penicillins Anaphylaxis and Other (See Comments)   Shellfish Allergy Swelling   "

## 2024-04-24 LAB — URINALYSIS, ROUTINE W REFLEX MICROSCOPIC
Bilirubin, UA: NEGATIVE
Glucose, UA: NEGATIVE
Ketones, UA: NEGATIVE
Nitrite, UA: NEGATIVE
Protein,UA: NEGATIVE
RBC, UA: NEGATIVE
Specific Gravity, UA: 1.024 (ref 1.005–1.030)
Urobilinogen, Ur: 1 mg/dL (ref 0.2–1.0)
pH, UA: 7 (ref 5.0–7.5)

## 2024-04-24 LAB — MICROSCOPIC EXAMINATION: Casts: NONE SEEN /LPF

## 2024-04-24 LAB — PROTEIN / CREATININE RATIO, URINE
Creatinine, Urine: 146.4 mg/dL
Protein, Ur: 11.4 mg/dL
Protein/Creat Ratio: 78 mg/g{creat} (ref 0–200)

## 2024-05-21 ENCOUNTER — Encounter: Admitting: Registered Nurse
